# Patient Record
Sex: Female | Born: 2000 | Race: Black or African American | Hispanic: No | Marital: Single | Smoking: Former smoker
Health system: Southern US, Community
[De-identification: ages and names within clinical notes are randomized; demographics above are authoritative.]

## PROBLEM LIST (undated history)

## (undated) DIAGNOSIS — F419 Anxiety disorder, unspecified: Secondary | ICD-10-CM

## (undated) DIAGNOSIS — O139 Gestational [pregnancy-induced] hypertension without significant proteinuria, unspecified trimester: Secondary | ICD-10-CM

## (undated) DIAGNOSIS — J0391 Acute recurrent tonsillitis, unspecified: Secondary | ICD-10-CM

## (undated) DIAGNOSIS — Z789 Other specified health status: Secondary | ICD-10-CM

## (undated) DIAGNOSIS — D649 Anemia, unspecified: Secondary | ICD-10-CM

## (undated) HISTORY — DX: Anxiety disorder, unspecified: F41.9

## (undated) HISTORY — PX: GALLBLADDER SURGERY: SHX652

## (undated) HISTORY — DX: Gestational (pregnancy-induced) hypertension without significant proteinuria, unspecified trimester: O13.9

## (undated) HISTORY — PX: NO PAST SURGERIES: SHX2092

## (undated) HISTORY — DX: Anemia, unspecified: D64.9

---
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
## 2019-01-30 ENCOUNTER — Other Ambulatory Visit: Payer: Self-pay

## 2019-01-30 DIAGNOSIS — Z20822 Contact with and (suspected) exposure to covid-19: Secondary | ICD-10-CM

## 2019-01-31 LAB — NOVEL CORONAVIRUS, NAA: SARS-CoV-2, NAA: NOT DETECTED

## 2019-05-02 ENCOUNTER — Other Ambulatory Visit: Payer: Self-pay

## 2019-05-02 ENCOUNTER — Emergency Department (HOSPITAL_COMMUNITY)
Admission: EM | Admit: 2019-05-02 | Discharge: 2019-05-02 | Disposition: A | Discharge status: Home / Self Care | Payer: BC Managed Care – PPO | Attending: Emergency Medicine | Admitting: Emergency Medicine

## 2019-05-02 ENCOUNTER — Encounter (HOSPITAL_COMMUNITY): Payer: Self-pay | Admitting: Emergency Medicine

## 2019-05-02 DIAGNOSIS — Z20822 Contact with and (suspected) exposure to covid-19: Secondary | ICD-10-CM | POA: Insufficient documentation

## 2019-05-02 DIAGNOSIS — J029 Acute pharyngitis, unspecified: Secondary | ICD-10-CM | POA: Diagnosis present

## 2019-05-02 DIAGNOSIS — J45909 Unspecified asthma, uncomplicated: Secondary | ICD-10-CM | POA: Insufficient documentation

## 2019-05-02 DIAGNOSIS — J069 Acute upper respiratory infection, unspecified: Secondary | ICD-10-CM | POA: Diagnosis not present

## 2019-05-02 LAB — SARS CORONAVIRUS 2 (TAT 6-24 HRS): SARS Coronavirus 2: NEGATIVE

## 2019-05-02 LAB — GROUP A STREP BY PCR: Group A Strep by PCR: NOT DETECTED

## 2019-05-02 LAB — POC URINE PREG, ED: Preg Test, Ur: NEGATIVE

## 2019-05-02 MED ORDER — NAPROXEN 500 MG PO TABS: 500.0000 mg | ORAL_TABLET | Freq: Two times a day (BID) | ORAL | 0 refills | Status: DC | PRN

## 2019-05-02 NOTE — ED Provider Notes (Signed)
[facility] EMERGENCY DEPARTMENT Provider Note   CSN: [id number] Arrival date & time: 05/02/19  1318     History Chief Complaint  Patient presents with  . Sore Throat    Carolyn Jenkins is a 19 y.o. female with no significant PMH presents to the ED with a 10-day history of sore throat with radiation to the right ear.  She states Chloraseptic Spray, but that it continues to be painful to swallow.  Patient reports a history of tube placement for AOM as a child.  She lives with her family reports that they have all been sick, but have all since improved.  Patient also reports that while she does not have a history of asthma, she endorses intermittent wheezing and nonproductive cough.  She denies any chest pain or shortness of breath, headache or dizziness, abdominal pain, nausea or vomiting, or any urinary symptoms.  Patient also reports that her last menses were approximately 2 months ago and she would like to receive pregnancy testing as she is sexually active and does not use protection.  HPI     History reviewed. No pertinent past medical history.  There are no problems to display for this patient.   History reviewed. No pertinent surgical history.   OB History   No obstetric history on file.     No family history on file.  Social History   Tobacco Use  . Smoking status: Never Smoker  . Smokeless tobacco: Never Used  Substance Use Topics  . Alcohol use: No  . Drug use: Never    Home Medications Prior to Admission medications   Medication Sig Start Date End Date Taking? Authorizing Provider  naproxen (NAPROSYN) 500 MG tablet Take 1 tablet (500 mg total) by mouth 2 (two) times daily between meals as needed for moderate pain (Fever, chills, body aches). 05/02/19   Lorelee New, PA-C    Allergies    Patient has no known allergies.  Review of Systems   Review of Systems  All other systems reviewed and are negative.   Physical Exam Updated Vital  Signs BP (!) 141/87   Pulse (!) 112   Temp 99.7 F (37.6 C) (Oral)   Resp 18   SpO2 99%   Physical Exam Vitals and nursing note reviewed. Exam conducted with a chaperone present.  Constitutional:      Appearance: Normal appearance.  HENT:     Head: Normocephalic and atraumatic.     Ears:     Comments: Right ear: Evidence of prior to placement.  Pearly, nonerythematous.  Nonbulging.  Cone of light nondisplaced.  Mild cerumen in canal, but no impaction or swelling. Left ear: Normal.    Mouth/Throat:     Comments: Clear, patent oropharynx.  Mild tonsillitis bilaterally, but no exudates or stones noted.  No uvular deviation.  No tongue swelling or soft palate induration.  Normal dentition throughout.  No masses appreciated.  Tolerating secretions well. Eyes:     General: No scleral icterus.    Conjunctiva/sclera: Conjunctivae normal.  Pulmonary:     Effort: Pulmonary effort is normal.  Skin:    General: Skin is dry.  Neurological:     Mental Status: She is alert and oriented to person, place, and time.     GCS: GCS eye subscore is 4. GCS verbal subscore is 5. GCS motor subscore is 6.  Psychiatric:        Mood and Affect: Mood normal.  Behavior: Behavior normal.        Thought Content: Thought content normal.      ED Results / Procedures / Treatments   Labs (all labs ordered are listed, but only abnormal results are displayed) Labs Reviewed  GROUP A STREP BY PCR  SARS CORONAVIRUS 2 (TAT 6-24 HRS)  POC URINE PREG, ED    EKG None  Radiology No results found.  Procedures Procedures (including critical care time)  Medications Ordered in ED Medications - No data to display  ED Course  I have reviewed the triage vital signs and the nursing notes.  Pertinent labs & imaging results that were available during my care of the patient were reviewed by me and considered in my medical decision making (see chart for details).    MDM Rules/Calculators/A&P                       Patient admits that she has not been eating or drinking very well due to her sore throat symptoms.  She is mildly tachycardic, but likely at least partially attributable to dehydration.  Patient is negative for pregnancy.  Physical exam was benign.  Patient appeared to be in no acute distress.  There is no evidence of trismus, oropharynx is patent, no uvular deviation or masses appreciated, she was tolerating secretions without difficulty, and her ear exam was normal.  Given her group A strep PCR being negative, do not feel as though antibiotics are warranted at this time.  Instead, will continue to emphasize importance of over-the-counter medications for symptomatic relief.  While patient endorses 10-day history of sore throat in addition to intermittent nonproductive cough, will obtain send-out COVID-19 testing.  Instructed patient to continue to adhere to isolation guidelines while results pend.    Strict return precautions discussed with the patient.  All of the evaluation and work-up results were discussed with the patient and any family at bedside. They were provided opportunity to ask any additional questions and have none at this time. They have expressed understanding of verbal discharge instructions as well as return precautions and are agreeable to the plan.    Final Clinical Impression(s) / ED Diagnoses Final diagnoses:  Viral upper respiratory tract infection    Rx / DC Orders ED Discharge Orders         Ordered    naproxen (NAPROSYN) 500 MG tablet  2 times daily between meals PRN     05/02/19 1531           Corena Herter, PA-C 05/02/19 1531    Sherwood Gambler, MD 05/02/19 [phone]

## 2019-05-02 NOTE — ED Notes (Signed)
Patient Alert and oriented to baseline. Stable and ambulatory to baseline. Patient verbalized understanding of the discharge instructions.  Patient belongings were taken by the patient.   

## 2019-05-02 NOTE — ED Triage Notes (Signed)
C/o sore throat x 2 weeks that radiates to R ear.

## 2019-05-02 NOTE — Discharge Instructions (Signed)
In addition to [employer], please consider taking the naproxen as prescribed.  Also please use throat lozenges, warm tea with honey, and other over-the-counter medications for symptomatic relief of your cold symptoms.  Please continue to follow isolation guidelines pending results of your COVID-19 testing.

## 2019-05-04 ENCOUNTER — Other Ambulatory Visit: Payer: Self-pay

## 2019-05-04 ENCOUNTER — Emergency Department (HOSPITAL_COMMUNITY): Payer: BC Managed Care – PPO

## 2019-05-04 ENCOUNTER — Emergency Department (HOSPITAL_COMMUNITY)
Admission: EM | Admit: 2019-05-04 | Discharge: 2019-05-04 | Disposition: A | Discharge status: Home / Self Care | Payer: BC Managed Care – PPO | Attending: Emergency Medicine | Admitting: Emergency Medicine

## 2019-05-04 DIAGNOSIS — B349 Viral infection, unspecified: Secondary | ICD-10-CM | POA: Insufficient documentation

## 2019-05-04 DIAGNOSIS — Z20822 Contact with and (suspected) exposure to covid-19: Secondary | ICD-10-CM | POA: Insufficient documentation

## 2019-05-04 DIAGNOSIS — J029 Acute pharyngitis, unspecified: Secondary | ICD-10-CM | POA: Diagnosis present

## 2019-05-04 LAB — MONONUCLEOSIS SCREEN: Mono Screen: NEGATIVE

## 2019-05-04 LAB — I-STAT CHEM 8, ED
BUN: 4 mg/dL — ABNORMAL LOW (ref 6–20)
Calcium, Ion: 1.13 mmol/L — ABNORMAL LOW (ref 1.15–1.40)
Chloride: 105 mmol/L (ref 98–111)
Creatinine, Ser: 0.6 mg/dL (ref 0.44–1.00)
Glucose, Bld: 105 mg/dL — ABNORMAL HIGH (ref 70–99)
HCT: 39 % (ref 36.0–46.0)
Hemoglobin: 13.3 g/dL (ref 12.0–15.0)
Potassium: 4.2 mmol/L (ref 3.5–5.1)
Sodium: 139 mmol/L (ref 135–145)
TCO2: 26 mmol/L (ref 22–32)

## 2019-05-04 LAB — I-STAT BETA HCG BLOOD, ED (MC, WL, AP ONLY): I-stat hCG, quantitative: <5 m[IU]/mL (ref <5)

## 2019-05-04 LAB — GROUP A STREP BY PCR: Group A Strep by PCR: NOT DETECTED

## 2019-05-04 MED ORDER — IOHEXOL 300 MG/ML  SOLN
75.0000 mL | Freq: Once | INTRAMUSCULAR | Status: AC | PRN
  Administered 2019-05-04: 75 mL via INTRAVENOUS

## 2019-05-04 MED ORDER — ACETAMINOPHEN 325 MG PO TABS
650.0000 mg | ORAL_TABLET | Freq: Once | ORAL | Status: AC
  Administered 2019-05-04: 650 mg via ORAL
  Filled 2019-05-04: qty 2

## 2019-05-04 MED ORDER — DEXAMETHASONE SODIUM PHOSPHATE 10 MG/ML IJ SOLN
10.0000 mg | Freq: Once | INTRAMUSCULAR | Status: AC
  Administered 2019-05-04: 10 mg via INTRAMUSCULAR
  Filled 2019-05-04: qty 1

## 2019-05-04 MED ORDER — LIDOCAINE VISCOUS HCL 2 % MT SOLN: 15.0000 mL | OROMUCOSAL | 0 refills | Status: DC | PRN

## 2019-05-04 MED ORDER — LIDOCAINE VISCOUS HCL 2 % MT SOLN
15.0000 mL | Freq: Once | OROMUCOSAL | Status: AC
  Administered 2019-05-04: 15 mL via OROMUCOSAL
  Filled 2019-05-04: qty 15

## 2019-05-04 MED ORDER — KETOROLAC TROMETHAMINE 30 MG/ML IJ SOLN
30.0000 mg | Freq: Once | INTRAMUSCULAR | Status: AC
  Administered 2019-05-04: 30 mg via INTRAMUSCULAR
  Filled 2019-05-04: qty 1

## 2019-05-04 NOTE — ED Provider Notes (Signed)
[facility] EMERGENCY DEPARTMENT Provider Note   CSN: [id number] Arrival date & time: 05/04/19  1143     History Chief Complaint  Patient presents with  . Sore Throat    Carolyn Jenkins is a 19 y.o. female with no significant past medical history who presents to the ED due to worsening sore throat x2 weeks that radiates into her right ear.  Chart reviewed.  Patient was seen in the ED on 05/02/2019 for the same complaint and was negative for Covid and strep throat.  Patient states her sore throat has gotten worse and is associated with a fever, headache, and right otalgia. T. Max 101 this morning. Patient rates her pain a 10/10. Sore throat is worse on the right side and associated with changes to her voice. Her mother just had a positive COVID exposure. Patient denies cough, shortness of breath, abdominal pain, nausea, vomiting, and diarrhea. Patient denies difficulties swallowing, but admits to increased pain with swallowing.    No past medical history on file.  There are no problems to display for this patient.   No past surgical history on file.   OB History   No obstetric history on file.     No family history on file.  Social History   Tobacco Use  . Smoking status: Never Smoker  . Smokeless tobacco: Never Used  Substance Use Topics  . Alcohol use: No  . Drug use: Never    Home Medications Prior to Admission medications   Medication Sig Start Date End Date Taking? Authorizing Provider  naproxen (NAPROSYN) 500 MG tablet Take 1 tablet (500 mg total) by mouth 2 (two) times daily between meals as needed for moderate pain (Fever, chills, body aches). 05/02/19  Yes Lorelee New, PA-C  lidocaine (XYLOCAINE) 2 % solution Use as directed 15 mLs in the mouth or throat every 4 (four) hours as needed for mouth pain. 05/04/19   Mannie Stabile, PA-C    Allergies    Patient has no known allergies.  Review of Systems   Review of Systems  Constitutional:  Positive for appetite change (decreased due to pain) and fever. Negative for chills.  HENT: Positive for ear pain (right) and sore throat. Negative for drooling, rhinorrhea, trouble swallowing and voice change.   Respiratory: Negative for shortness of breath.   Cardiovascular: Negative for chest pain.  Gastrointestinal: Negative for abdominal pain, diarrhea, nausea and vomiting.  Neurological: Positive for headaches.  All other systems reviewed and are negative.   Physical Exam Updated Vital Signs BP 123/67 (BP Location: Left Arm)   Pulse (!) 118   Temp 99.9 F (37.7 C) (Oral)   Resp 18   Ht 5\' 9"  (1.753 m)   Wt 104.3 kg   SpO2 100%   BMI 33.97 kg/m   Physical Exam Vitals and nursing note reviewed.  Constitutional:      General: She is not in acute distress.    Appearance: She is not toxic-appearing.  HENT:     Head: Normocephalic.     Right Ear: Tympanic membrane normal. There is impacted cerumen.     Left Ear: Tympanic membrane normal. There is impacted cerumen.     Ears:     Comments: Both ear canals impacted with cerumen which makes full visualization of TM difficulty. No bulging or erythema of either TM.     Nose: Nose normal.     Mouth/Throat:     Comments: Posterior oropharynx clear and mucous membranes moist,  there is mild erythema with 2+ tonsillar hypertrophy worse on the right side with mild uvula deviation. no tonsillar exudates, no trismus, tolerating secretions without difficulty. Mild muffled phonation  Eyes:     Pupils: Pupils are equal, round, and reactive to light.  Neck:     Comments: No meningismus Cardiovascular:     Rate and Rhythm: Regular rhythm. Tachycardia present.     Pulses: Normal pulses.     Heart sounds: Normal heart sounds. No murmur. No friction rub. No gallop.   Pulmonary:     Effort: Pulmonary effort is normal.     Breath sounds: Normal breath sounds.  Abdominal:     General: Abdomen is flat. There is no distension.     Palpations:  Abdomen is soft.     Tenderness: There is no abdominal tenderness. There is no guarding or rebound.  Musculoskeletal:     Cervical back: Normal range of motion and neck supple.     Comments: Able to move all 4 extremities without difficulty.   Skin:    General: Skin is warm.  Neurological:     General: No focal deficit present.     Mental Status: She is alert.  Psychiatric:        Mood and Affect: Mood normal.     ED Results / Procedures / Treatments   Labs (all labs ordered are listed, but only abnormal results are displayed) Labs Reviewed  I-STAT CHEM 8, ED - Abnormal; Notable for the following components:      Result Value   BUN 4 (*)    Glucose, Bld 105 (*)    Calcium, Ion 1.13 (*)    All other components within normal limits  GROUP A STREP BY PCR  NOVEL CORONAVIRUS, NAA (HOSP ORDER, SEND-OUT TO REF LAB; TAT 18-24 HRS)  MONONUCLEOSIS SCREEN  I-STAT BETA HCG BLOOD, ED ([facility], [facility], [facility] ONLY)    EKG None  Radiology CT Soft Tissue Neck W Contrast  Result Date: 05/04/2019 CLINICAL DATA:  Sore throat EXAM: CT NECK WITH CONTRAST TECHNIQUE: Multidetector CT imaging of the neck was performed using the standard protocol following the bolus administration of intravenous contrast. CONTRAST:  66mL OMNIPAQUE IOHEXOL 300 MG/ML  SOLN COMPARISON:  None. FINDINGS: Pharynx and larynx: There is asymmetric right palatine tonsil enlargement. Ill-defined low-attenuation is present, but there is also significant streak artifact and this may be artifactual. Epiglottis is normal. Airway is patent. Salivary glands: Parotid and submandibular glands. Thyroid: Unremarkable. Lymph nodes: There are no enlarged lymph nodes. Vascular: Major neck vessels are patent. Limited intracranial: No abnormal enhancement. Visualized orbits: Unremarkable Mastoids and visualized paranasal sinuses: Aerated Skeleton: Mild reversal of cervical lordosis.  Normal Upper chest: Included lung apices are clear. Other: None. IMPRESSION:  Asymmetric right palatine tonsil enlargement. Ill-defined low attenuation is present, but this may be artifactual and there is no definite abscess. Electronically Signed   By: Macy Mis M.D.   On: 05/04/2019 15:53    Procedures Procedures (including critical care time)  Medications Ordered in ED Medications  acetaminophen (TYLENOL) tablet 650 mg (has no administration in time range)  dexamethasone (DECADRON) injection 10 mg (has no administration in time range)  ketorolac (TORADOL) 30 MG/ML injection 30 mg (has no administration in time range)  lidocaine (XYLOCAINE) 2 % viscous mouth solution 15 mL (15 mLs Mouth/Throat Given 05/04/19 1428)  iohexol (OMNIPAQUE) 300 MG/ML solution 75 mL (75 mLs Intravenous Contrast Given 05/04/19 1526)    ED Course  I have reviewed  the triage vital signs and the nursing notes.  Pertinent labs & imaging results that were available during my care of the patient were reviewed by me and considered in my medical decision making (see chart for details).    MDM Rules/Calculators/A&P                     19 year old female presents to the ED due to worsening sore throat x2 weeks.  Patient was seen on 05/02/2019 with reassuring work-up and was negative for Covid and strep throat.  Sore throat is worse on the right side associated with changes to voice.  Patient is borderline febrile at 99.9 F and tachycardic at 118 likely due to to fever. Chart reviewed and patient appears to typically have an elevated heart rate. Patient in no acute distress and non-toxic appearing. Posterior oropharynx clear and mucous membranes moist, there is mild erythema with 2+ tonsillar hypertrophy worse on the right side with mild uvula deviation. no tonsillar exudates, no trismus, tolerating secretions without difficulty. Mild muffled phonation. Given patient's increased edema on right side, will obtain CT scan to rule out abscess.   Mono spot negative.  Strep test negative.  Covid test  pending. Patient advised to self-quarantine until results become available. Chem-8 reassuring.  CT scan personally reviewed which demonstrates: Asymmetric right palatine tonsil enlargement. Ill-defined low  attenuation is present, but this may be artifactual and there is no  definite abscess.   Will treat with decadron and Toradol here in the ED. Will treat symptomatically with viscous lidocaine. Patient advised to follow-up with pediatrician within the next week for further evaluation. Strict ED precautions discussed with patient. Patient states understanding and agrees to plan. Patient discharged home in no acute distress and stable vitals  Final Clinical Impression(s) / ED Diagnoses Final diagnoses:  Viral pharyngitis    Rx / DC Orders ED Discharge Orders         Ordered    lidocaine (XYLOCAINE) 2 % solution  Every 4 hours PRN     05/04/19 1622           Mannie Stabile, PA-C 05/04/19 1652    Melene Plan, DO 05/05/19 1503

## 2019-05-04 NOTE — Discharge Instructions (Addendum)
As discussed, your strep and mono test were negative. Your COVID test is still pending. Continue to self quarantine until your results become available. I am sending you home with numbing solution for your throat. You may use as needed for pain. Continue to take Tylenol if you develop a fever. Drink a lot of fluids. Your CT scan was negative for any abscess in your throat. Follow-up with your pediatrician within the next week for further evaluation. Return to the ER for new or worsening symptoms.

## 2019-05-04 NOTE — ED Triage Notes (Signed)
Sore throat x 2 weeks- seen here Saturday-- neg for strep

## 2019-05-05 LAB — NOVEL CORONAVIRUS, NAA (HOSP ORDER, SEND-OUT TO REF LAB; TAT 18-24 HRS): SARS-CoV-2, NAA: NOT DETECTED

## 2019-05-08 ENCOUNTER — Other Ambulatory Visit: Payer: Self-pay

## 2019-05-08 ENCOUNTER — Emergency Department (HOSPITAL_COMMUNITY)
Admission: EM | Admit: 2019-05-08 | Discharge: 2019-05-08 | Disposition: A | Discharge status: Home / Self Care | Payer: BC Managed Care – PPO | Attending: Emergency Medicine | Admitting: Emergency Medicine

## 2019-05-08 DIAGNOSIS — J36 Peritonsillar abscess: Secondary | ICD-10-CM | POA: Insufficient documentation

## 2019-05-08 DIAGNOSIS — J029 Acute pharyngitis, unspecified: Secondary | ICD-10-CM | POA: Diagnosis present

## 2019-05-08 LAB — CBC WITH DIFFERENTIAL/PLATELET
Abs Immature Granulocytes: 0.05 10*3/uL (ref 0.00–0.07)
Basophils Absolute: 0 10*3/uL (ref 0.0–0.1)
Basophils Relative: 0 %
Eosinophils Absolute: 0 10*3/uL (ref 0.0–0.5)
Eosinophils Relative: 0 %
HCT: 39.4 % (ref 36.0–46.0)
Hemoglobin: 12.8 g/dL (ref 12.0–15.0)
Immature Granulocytes: 0 %
Lymphocytes Relative: 15 %
Lymphs Abs: 1.7 10*3/uL (ref 0.7–4.0)
MCH: 27.8 pg (ref 26.0–34.0)
MCHC: 32.5 g/dL (ref 30.0–36.0)
MCV: 85.7 fL (ref 80.0–100.0)
Monocytes Absolute: 1.1 10*3/uL — ABNORMAL HIGH (ref 0.1–1.0)
Monocytes Relative: 10 %
Neutro Abs: 8.6 10*3/uL — ABNORMAL HIGH (ref 1.7–7.7)
Neutrophils Relative %: 75 %
Platelets: 344 10*3/uL (ref 150–400)
RBC: 4.6 MIL/uL (ref 3.87–5.11)
RDW: 13.6 % (ref 11.5–15.5)
WBC: 11.4 10*3/uL — ABNORMAL HIGH (ref 4.0–10.5)
nRBC: 0 % (ref 0.0–0.2)

## 2019-05-08 LAB — BASIC METABOLIC PANEL
Anion gap: 12 (ref 5–15)
BUN: 7 mg/dL (ref 6–20)
CO2: 24 mmol/L (ref 22–32)
Calcium: 9.1 mg/dL (ref 8.9–10.3)
Chloride: 103 mmol/L (ref 98–111)
Creatinine, Ser: 0.84 mg/dL (ref 0.44–1.00)
GFR calc Af Amer: >60 mL/min (ref >60)
GFR calc non Af Amer: >60 mL/min (ref >60)
Glucose, Bld: 94 mg/dL (ref 70–99)
Potassium: 4.1 mmol/L (ref 3.5–5.1)
Sodium: 139 mmol/L (ref 135–145)

## 2019-05-08 MED ORDER — CLINDAMYCIN HCL 300 MG PO CAPS: 300.0000 mg | ORAL_CAPSULE | Freq: Three times a day (TID) | ORAL | 0 refills | Status: DC

## 2019-05-08 MED ORDER — ONDANSETRON HCL 4 MG/2ML IJ SOLN
4.0000 mg | Freq: Once | INTRAMUSCULAR | Status: AC
  Administered 2019-05-08: 19:00:00 4 mg via INTRAVENOUS
  Filled 2019-05-08: qty 2

## 2019-05-08 MED ORDER — SODIUM CHLORIDE 0.9 % IV BOLUS
1000.0000 mL | Freq: Once | INTRAVENOUS | Status: AC
  Administered 2019-05-08: 19:00:00 1000 mL via INTRAVENOUS

## 2019-05-08 MED ORDER — CLINDAMYCIN PHOSPHATE 600 MG/50ML IV SOLN
600.0000 mg | Freq: Once | INTRAVENOUS | Status: AC
  Administered 2019-05-08: 19:00:00 600 mg via INTRAVENOUS
  Filled 2019-05-08: qty 50

## 2019-05-08 MED ORDER — BENZOCAINE 20 % MT AERO
INHALATION_SPRAY | Freq: Once | OROMUCOSAL | Status: DC
  Filled 2019-05-08: qty 57

## 2019-05-08 MED ORDER — DEXAMETHASONE SODIUM PHOSPHATE 10 MG/ML IJ SOLN
10.0000 mg | Freq: Once | INTRAMUSCULAR | Status: AC
  Administered 2019-05-08: 19:00:00 10 mg via INTRAVENOUS
  Filled 2019-05-08: qty 1

## 2019-05-08 MED ORDER — MORPHINE SULFATE (PF) 4 MG/ML IV SOLN
4.0000 mg | Freq: Once | INTRAVENOUS | Status: AC
  Administered 2019-05-08: 4 mg via INTRAVENOUS
  Filled 2019-05-08: qty 1

## 2019-05-08 NOTE — ED Provider Notes (Signed)
[facility] EMERGENCY DEPARTMENT Provider Note   CSN: [id number] Arrival date & time: 05/08/19  1553     History Chief Complaint  Patient presents with  . Sore Throat    Carolyn Jenkins is a 19 y.o. female.  19 year old female presents with complaint of ongoing and worsening right-sided sore throat with painful swallowing.  Patient reports symptom onset 2 weeks ago, progressively worsening.  Patient was seen in the emergency room on January 9 and January 11 for same.  Patient has had 2 - Covid tests, 2 - strep test, did have a negative mono test on January 11.  Patient had a CT scan on January 11 showing asymmetric right pontine tonsil enlargement without obvious abscess.  Patient was given Decadron and Toradol at that visit and advised to follow-up with her PCP.  Patient followed up with PCP today and was referred to the ER.  Patient reports some discomfort with opening her mouth, unable to fully open her mouth.  Reports feeling feverish at home with temps of 99.5.  Patient was able to eat some chicken noodle soup today but reports it is very difficult and painful to swallow.  No other complaints or concerns.        No past medical history on file.  There are no problems to display for this patient.   No past surgical history on file.   OB History   No obstetric history on file.     No family history on file.  Social History   Tobacco Use  . Smoking status: Never Smoker  . Smokeless tobacco: Never Used  Substance Use Topics  . Alcohol use: No  . Drug use: Never    Home Medications Prior to Admission medications   Medication Sig Start Date End Date Taking? Authorizing Provider  clindamycin (CLEOCIN) 300 MG capsule Take 1 capsule (300 mg total) by mouth 3 (three) times daily for 10 days. 05/08/19 05/18/19  Suella Broad A, PA-C  lidocaine (XYLOCAINE) 2 % solution Use as directed 15 mLs in the mouth or throat every 4 (four) hours as needed for mouth  pain. 05/04/19   Suzy Bouchard, PA-C  naproxen (NAPROSYN) 500 MG tablet Take 1 tablet (500 mg total) by mouth 2 (two) times daily between meals as needed for moderate pain (Fever, chills, body aches). 05/02/19   Corena Herter, PA-C    Allergies    Patient has no known allergies.  Review of Systems   Review of Systems  Constitutional: Positive for fever.  HENT: Positive for sore throat. Negative for congestion.   Respiratory: Negative for cough.   Gastrointestinal: Negative for nausea and vomiting.  Musculoskeletal: Negative for arthralgias and myalgias.  Skin: Negative for rash and wound.  Allergic/Immunologic: Negative for immunocompromised state.  Neurological: Negative for weakness.  Hematological: Negative for adenopathy.  Psychiatric/Behavioral: Negative for confusion.  All other systems reviewed and are negative.   Physical Exam Updated Vital Signs BP 112/88   Pulse (!) 102   Temp 98.6 F (37 C) (Oral)   Resp 16   SpO2 99%   Physical Exam Vitals and nursing note reviewed.  Constitutional:      General: She is not in acute distress.    Appearance: She is well-developed. She is not diaphoretic.  HENT:     Head: Normocephalic and atraumatic.     Jaw: Trismus present.     Comments: 2 finger trismus, muffled voice     Mouth/Throat:  Mouth: Mucous membranes are moist.     Tonsils: No tonsillar exudate.   Cardiovascular:     Rate and Rhythm: Normal rate and regular rhythm.  Pulmonary:     Effort: Pulmonary effort is normal.     Breath sounds: Normal breath sounds.  Lymphadenopathy:     Cervical: No cervical adenopathy.  Skin:    General: Skin is warm and dry.     Findings: No erythema.  Neurological:     Mental Status: She is alert and oriented to person, place, and time.  Psychiatric:        Behavior: Behavior normal.     ED Results / Procedures / Treatments   Labs (all labs ordered are listed, but only abnormal results are displayed) Labs  Reviewed  CBC WITH DIFFERENTIAL/PLATELET - Abnormal; Notable for the following components:      Result Value   WBC 11.4 (*)    Neutro Abs 8.6 (*)    Monocytes Absolute 1.1 (*)    All other components within normal limits  AEROBIC/ANAEROBIC CULTURE (SURGICAL/DEEP WOUND)  BASIC METABOLIC PANEL    EKG None  Radiology No results found.  Procedures Procedures (including critical care time)  Medications Ordered in ED Medications  Benzocaine (HURRCAINE) 20 % mouth spray ( Mouth/Throat Not Given 05/08/19 2123)  sodium chloride 0.9 % bolus 1,000 mL (0 mLs Intravenous Stopped 05/08/19 2024)  dexamethasone (DECADRON) injection 10 mg (10 mg Intravenous Given 05/08/19 1842)  clindamycin (CLEOCIN) IVPB 600 mg (0 mg Intravenous Stopped 05/08/19 1913)  ondansetron (ZOFRAN) injection 4 mg (4 mg Intravenous Given 05/08/19 1842)  morphine 4 MG/ML injection 4 mg (4 mg Intravenous Given 05/08/19 1842)    ED Course  I have reviewed the triage vital signs and the nursing notes.  Pertinent labs & imaging results that were available during my care of the patient were reviewed by me and considered in my medical decision making (see chart for details).  Clinical Course as of May 07 2133  Fri May 08, 2019  6650 19 year old female with 2-weeks of progressively worsening right-sided sore throat.  On exam the soft palate on the right significantly obstructs view of the tonsils and oropharynx, fullness of the left soft palate as well.  Patient tolerates exam with tongue depressor very well.  There is no cervical of adenopathy appreciated.  Patient appears to have approximate 2 finger trismus.  Patient is afebrile, she is mildly tachycardic although noted to be still on prior exams.  Discussed with Dr. Madilyn Hook, ER attending who has seen the patient, notes a similar exam findings, Dr. Madilyn Hook is able to see the uvula and posterior oropharynx although exam significantly limited.  Patient just had a CT scan soft tissue neck,  recommends consult with ENT for further guidance, will hold off on imaging at this time as patient had a CT scan 4 days ago.   [LM]  1812 Case discussed with Dr. Pollyann Kennedy, on call with ENT, recommends Clindamycin 300mg  TID, dc if tolerating Pos, follow up in the office next week if not improving, return to ER at any time for worsening or concerning symptoms.    [LM]  1839 Patient was seen by Dr. , ER attending, see his note regarding I&D of large right peritonsillar abscess.  Patient is feeling much better after needle aspiration of nearly 16 cc purulent material which was sent for culture.  Patient is phonating much clear, tolerating p.o. fluids.  Discussed today's visit with patient's mom with patient's consent.  Patient will start antibiotics as prescribed with plan for follow-up with ENT next week, mother and patient verbalized understanding of strict return to ER precautions.   [LM]    Clinical Course User Index [LM] Alden Hipp   MDM Rules/Calculators/A&P                      Final Clinical Impression(s) / ED Diagnoses Final diagnoses:  Peritonsillar abscess    Rx / DC Orders ED Discharge Orders         Ordered    clindamycin (CLEOCIN) 300 MG capsule  3 times daily     05/08/19 2118           Alden Hipp 05/08/19 2135    Eber Hong, MD 05/08/19 2158

## 2019-05-08 NOTE — ED Provider Notes (Signed)
Medical screening examination/treatment/procedure(s) were conducted as a shared visit with non-physician practitioner(s) and myself.  I personally evaluated the patient during the encounter.  Clinical Impression:   Final diagnoses:  Peritonsillar abscess   This patient is a 19 year old female presenting with recurrent and ongoing sore throat over the last 2 weeks, no abscess seen on CT scan from several days ago however she presents today with worsening swelling.  On exam she has a giant peritonsillar abscess clinically.  This was successfully drained, see procedure note attached.  The patient will be placed on antibiotics and follow-up with ear nose and throat.  The patient has a very patent oropharynx and is compliant with medications and understands indications for return.  Marland Kitchen.Incision and Drainage  Date/Time: 05/08/2019 8:51 PM Performed by: Eber Hong, MD Authorized by: Eber Hong, MD   Consent:    Consent obtained:  Verbal   Consent given by:  Patient   Risks discussed:  Bleeding, damage to other organs, incomplete drainage, infection and pain   Alternatives discussed:  Delayed treatment Location:    Type:  Abscess   Size:  Large   Location:  Mouth (peritonsillar R)   Mouth location:  Peritonsillar Anesthesia (see MAR for exact dosages):    Anesthesia method:  Local infiltration   Local anesthetic:  Bupivacaine 0.25% w/o epi Procedure type:    Complexity:  Complex Procedure details:    Needle aspiration: yes     Drainage:  Purulent   Drainage amount:  Copious Post-procedure details:    Patient tolerance of procedure:  Tolerated well, no immediate complications Comments:     15 to 16 cc of purulent and slightly bloody material was aspirated from the abscess        Eber Hong, MD 05/08/19 2158

## 2019-05-08 NOTE — Discharge Instructions (Addendum)
Take antibiotics as prescribed and complete the full course. It is very important that you stay hydrated with water or electrolyte solution such as Gatorade. Take Motrin and Tylenol as needed as directed for pain.  Follow-up with ENT next week, you can call on Monday to schedule an appointment. Return to the emergency room at anytime for any worsening or concerning symptoms.

## 2019-05-08 NOTE — ED Triage Notes (Signed)
Pt here for sore throat x 3 weeks. Seen for same on 1/11, no change since that visit.

## 2019-05-09 ENCOUNTER — Other Ambulatory Visit: Payer: Self-pay

## 2019-05-09 ENCOUNTER — Inpatient Hospital Stay (HOSPITAL_COMMUNITY)
Admission: EM | Admit: 2019-05-09 | Discharge: 2019-05-12 | DRG: 153 | Disposition: A | Discharge status: Home / Self Care | Payer: BC Managed Care – PPO | Attending: Internal Medicine | Admitting: Internal Medicine

## 2019-05-09 ENCOUNTER — Encounter (HOSPITAL_COMMUNITY): Payer: Self-pay | Admitting: Emergency Medicine

## 2019-05-09 DIAGNOSIS — Z20822 Contact with and (suspected) exposure to covid-19: Secondary | ICD-10-CM | POA: Diagnosis present

## 2019-05-09 DIAGNOSIS — J36 Peritonsillar abscess: Principal | ICD-10-CM | POA: Diagnosis present

## 2019-05-09 DIAGNOSIS — E669 Obesity, unspecified: Secondary | ICD-10-CM | POA: Diagnosis present

## 2019-05-09 DIAGNOSIS — J039 Acute tonsillitis, unspecified: Secondary | ICD-10-CM | POA: Diagnosis present

## 2019-05-09 DIAGNOSIS — Z79899 Other long term (current) drug therapy: Secondary | ICD-10-CM

## 2019-05-09 DIAGNOSIS — Z791 Long term (current) use of non-steroidal anti-inflammatories (NSAID): Secondary | ICD-10-CM

## 2019-05-09 DIAGNOSIS — F17290 Nicotine dependence, other tobacco product, uncomplicated: Secondary | ICD-10-CM | POA: Diagnosis present

## 2019-05-09 DIAGNOSIS — Z68.41 Body mass index (BMI) pediatric, greater than or equal to 95th percentile for age: Secondary | ICD-10-CM

## 2019-05-09 MED ORDER — CLINDAMYCIN PHOSPHATE 300 MG/50ML IV SOLN
300.0000 mg | Freq: Once | INTRAVENOUS | Status: AC
  Administered 2019-05-10: 300 mg via INTRAVENOUS
  Filled 2019-05-09: qty 50

## 2019-05-09 NOTE — ED Triage Notes (Signed)
Pt presents with return of R sided throat swelling. Pt reports she had an abscess drained here yesterday.  Pt states she is unable to swallow, voice sounds muffled.

## 2019-05-09 NOTE — ED Notes (Signed)
Carolyn MassonMom - [phone]

## 2019-05-10 ENCOUNTER — Encounter (HOSPITAL_COMMUNITY): Payer: Self-pay | Admitting: Internal Medicine

## 2019-05-10 ENCOUNTER — Other Ambulatory Visit: Payer: Self-pay | Admitting: Internal Medicine

## 2019-05-10 ENCOUNTER — Emergency Department (HOSPITAL_COMMUNITY): Payer: BC Managed Care – PPO

## 2019-05-10 DIAGNOSIS — Z791 Long term (current) use of non-steroidal anti-inflammatories (NSAID): Secondary | ICD-10-CM | POA: Diagnosis not present

## 2019-05-10 DIAGNOSIS — J36 Peritonsillar abscess: Principal | ICD-10-CM

## 2019-05-10 DIAGNOSIS — Z20822 Contact with and (suspected) exposure to covid-19: Secondary | ICD-10-CM | POA: Diagnosis present

## 2019-05-10 DIAGNOSIS — F17290 Nicotine dependence, other tobacco product, uncomplicated: Secondary | ICD-10-CM | POA: Diagnosis present

## 2019-05-10 DIAGNOSIS — Z79899 Other long term (current) drug therapy: Secondary | ICD-10-CM | POA: Diagnosis not present

## 2019-05-10 DIAGNOSIS — J039 Acute tonsillitis, unspecified: Secondary | ICD-10-CM | POA: Diagnosis present

## 2019-05-10 DIAGNOSIS — E669 Obesity, unspecified: Secondary | ICD-10-CM | POA: Diagnosis present

## 2019-05-10 DIAGNOSIS — Z68.41 Body mass index (BMI) pediatric, greater than or equal to 95th percentile for age: Secondary | ICD-10-CM | POA: Diagnosis not present

## 2019-05-10 LAB — CBC WITH DIFFERENTIAL/PLATELET
Abs Immature Granulocytes: 0.04 10*3/uL (ref 0.00–0.07)
Basophils Absolute: 0 10*3/uL (ref 0.0–0.1)
Basophils Relative: 0 %
Eosinophils Absolute: 0 10*3/uL (ref 0.0–0.5)
Eosinophils Relative: 0 %
HCT: 35.6 % — ABNORMAL LOW (ref 36.0–46.0)
Hemoglobin: 11.4 g/dL — ABNORMAL LOW (ref 12.0–15.0)
Immature Granulocytes: 0 %
Lymphocytes Relative: 25 %
Lymphs Abs: 2.7 10*3/uL (ref 0.7–4.0)
MCH: 27.5 pg (ref 26.0–34.0)
MCHC: 32 g/dL (ref 30.0–36.0)
MCV: 86 fL (ref 80.0–100.0)
Monocytes Absolute: 1 10*3/uL (ref 0.1–1.0)
Monocytes Relative: 10 %
Neutro Abs: 6.9 10*3/uL (ref 1.7–7.7)
Neutrophils Relative %: 65 %
Platelets: 331 10*3/uL (ref 150–400)
RBC: 4.14 MIL/uL (ref 3.87–5.11)
RDW: 14.1 % (ref 11.5–15.5)
WBC: 10.8 10*3/uL — ABNORMAL HIGH (ref 4.0–10.5)
nRBC: 0 % (ref 0.0–0.2)

## 2019-05-10 LAB — COMPREHENSIVE METABOLIC PANEL
ALT: 18 U/L (ref 0–44)
AST: 15 U/L (ref 15–41)
Albumin: 3.1 g/dL — ABNORMAL LOW (ref 3.5–5.0)
Alkaline Phosphatase: 67 U/L (ref 38–126)
Anion gap: 9 (ref 5–15)
BUN: 11 mg/dL (ref 6–20)
CO2: 25 mmol/L (ref 22–32)
Calcium: 8.6 mg/dL — ABNORMAL LOW (ref 8.9–10.3)
Chloride: 104 mmol/L (ref 98–111)
Creatinine, Ser: 0.82 mg/dL (ref 0.44–1.00)
GFR calc Af Amer: >60 mL/min (ref >60)
GFR calc non Af Amer: >60 mL/min (ref >60)
Glucose, Bld: 110 mg/dL — ABNORMAL HIGH (ref 70–99)
Potassium: 3.6 mmol/L (ref 3.5–5.1)
Sodium: 138 mmol/L (ref 135–145)
Total Bilirubin: <0.1 mg/dL — ABNORMAL LOW (ref 0.3–1.2)
Total Protein: 7 g/dL (ref 6.5–8.1)

## 2019-05-10 LAB — SARS CORONAVIRUS 2 (TAT 6-24 HRS): SARS Coronavirus 2: NEGATIVE

## 2019-05-10 LAB — HIV ANTIBODY (ROUTINE TESTING W REFLEX): HIV Screen 4th Generation wRfx: NONREACTIVE

## 2019-05-10 MED ORDER — KETOROLAC TROMETHAMINE 30 MG/ML IJ SOLN
15.0000 mg | Freq: Once | INTRAMUSCULAR | Status: AC
  Administered 2019-05-10: 15 mg via INTRAVENOUS
  Filled 2019-05-10: qty 1

## 2019-05-10 MED ORDER — ACETAMINOPHEN 325 MG PO TABS
650.0000 mg | ORAL_TABLET | Freq: Four times a day (QID) | ORAL | Status: DC | PRN
  Administered 2019-05-10 – 2019-05-11 (×3): 650 mg via ORAL
  Filled 2019-05-10 (×3): qty 2

## 2019-05-10 MED ORDER — KETOROLAC TROMETHAMINE 30 MG/ML IJ SOLN
30.0000 mg | Freq: Four times a day (QID) | INTRAMUSCULAR | Status: DC | PRN
  Administered 2019-05-10: 30 mg via INTRAVENOUS
  Filled 2019-05-10 (×2): qty 1

## 2019-05-10 MED ORDER — ONDANSETRON HCL 4 MG/2ML IJ SOLN
4.0000 mg | Freq: Four times a day (QID) | INTRAMUSCULAR | Status: DC | PRN
  Administered 2019-05-10: 4 mg via INTRAVENOUS
  Filled 2019-05-10: qty 2

## 2019-05-10 MED ORDER — HYDROCODONE-ACETAMINOPHEN 7.5-325 MG/15ML PO SOLN
10.0000 mL | Freq: Once | ORAL | Status: AC
  Administered 2019-05-10: 10 mL via ORAL
  Filled 2019-05-10: qty 15

## 2019-05-10 MED ORDER — KETOROLAC TROMETHAMINE 30 MG/ML IJ SOLN
30.0000 mg | Freq: Four times a day (QID) | INTRAMUSCULAR | Status: DC | PRN
  Administered 2019-05-10: 30 mg via INTRAVENOUS
  Filled 2019-05-10: qty 1

## 2019-05-10 MED ORDER — HYDROCODONE-ACETAMINOPHEN 5-325 MG PO TABS
1.0000 | ORAL_TABLET | ORAL | Status: DC | PRN
  Administered 2019-05-10: 1 via ORAL
  Filled 2019-05-10: qty 1

## 2019-05-10 MED ORDER — SODIUM CHLORIDE 0.9 % IV SOLN
3.0000 g | Freq: Four times a day (QID) | INTRAVENOUS | Status: DC
  Administered 2019-05-10 – 2019-05-12 (×8): 3 g via INTRAVENOUS
  Filled 2019-05-10 (×2): qty 3
  Filled 2019-05-10: qty 8
  Filled 2019-05-10 (×4): qty 3
  Filled 2019-05-10: qty 8
  Filled 2019-05-10 (×2): qty 3
  Filled 2019-05-10: qty 8

## 2019-05-10 MED ORDER — PHENOL 1.4 % MT LIQD: 1.0000 | OROMUCOSAL | Status: DC | PRN

## 2019-05-10 MED ORDER — CLINDAMYCIN HCL 150 MG PO CAPS
300.0000 mg | ORAL_CAPSULE | Freq: Once | ORAL | Status: AC
  Administered 2019-05-10: 300 mg via ORAL
  Filled 2019-05-10: qty 2

## 2019-05-10 MED ORDER — HYDROCODONE-ACETAMINOPHEN 5-325 MG PO TABS: 1.0000 | ORAL_TABLET | Freq: Once | ORAL | Status: DC

## 2019-05-10 MED ORDER — SODIUM CHLORIDE 0.9 % IV SOLN
3.0000 g | Freq: Four times a day (QID) | INTRAVENOUS | Status: DC
  Administered 2019-05-10: 3 g via INTRAVENOUS
  Filled 2019-05-10: qty 8
  Filled 2019-05-10: qty 3
  Filled 2019-05-10 (×4): qty 8

## 2019-05-10 MED ORDER — ACETAMINOPHEN 650 MG RE SUPP: 650.0000 mg | Freq: Four times a day (QID) | RECTAL | Status: DC | PRN

## 2019-05-10 MED ORDER — LACTATED RINGERS IV SOLN: INTRAVENOUS | Status: DC

## 2019-05-10 MED ORDER — IOHEXOL 300 MG/ML  SOLN
75.0000 mL | Freq: Once | INTRAMUSCULAR | Status: AC | PRN
  Administered 2019-05-10: 75 mL via INTRAVENOUS

## 2019-05-10 MED ORDER — ONDANSETRON HCL 4 MG PO TABS
4.0000 mg | ORAL_TABLET | Freq: Four times a day (QID) | ORAL | Status: DC | PRN
  Filled 2019-05-10: qty 1

## 2019-05-10 MED ORDER — HYDROCODONE-ACETAMINOPHEN 5-325 MG PO TABS
1.0000 | ORAL_TABLET | ORAL | Status: DC | PRN
  Administered 2019-05-10 – 2019-05-12 (×9): 1 via ORAL
  Filled 2019-05-10 (×10): qty 1

## 2019-05-10 NOTE — ED Notes (Signed)
Lunch of broth and mashed potatoes to room.  Pt has been drinking water and apple juice.

## 2019-05-10 NOTE — Consult Note (Signed)
Reason for Consult: Sore throat Referring Physician: Jonah Blue, MD  Carolyn Jenkins is an 19 y.o. female.  HPI: History of severe sore throat for several days now.  She has been to the emergency department a few times.  She had a CT scan done a few days ago which revealed low density in the right tonsil but not the peritonsillar space.  Reportedly she had this drained and felt better immediately but then started feeling worse again.  She returned again early this morning.  Repeat CT reveals what looks like intratonsillar abscess on the right.  Peritonsillar fat plane appears to be well preserved.  She has never had problems with her throat before.  She was prescribed clindamycin but has only had 1 dose.  History reviewed. No pertinent past medical history.  History reviewed. No pertinent surgical history.  History reviewed. No pertinent family history.  Social History:  reports that she has been smoking cigars. She has never used smokeless tobacco. She reports that she does not drink alcohol or use drugs.  Allergies: No Known Allergies  Medications: Reviewed  Results for orders placed or performed during the hospital encounter of 05/09/19 (from the past 48 hour(s))  CBC with Differential     Status: Abnormal   Collection Time: 05/10/19  1:48 AM  Result Value Ref Range   WBC 10.8 (H) 4.0 - 10.5 K/uL   RBC 4.14 3.87 - 5.11 MIL/uL   Hemoglobin 11.4 (L) 12.0 - 15.0 g/dL   HCT 22.0 (L) 25.4 - 27.0 %   MCV 86.0 80.0 - 100.0 fL   MCH 27.5 26.0 - 34.0 pg   MCHC 32.0 30.0 - 36.0 g/dL   RDW 62.3 76.2 - 83.1 %   Platelets 331 150 - 400 K/uL   nRBC 0.0 0.0 - 0.2 %   Neutrophils Relative % 65 %   Neutro Abs 6.9 1.7 - 7.7 K/uL   Lymphocytes Relative 25 %   Lymphs Abs 2.7 0.7 - 4.0 K/uL   Monocytes Relative 10 %   Monocytes Absolute 1.0 0.1 - 1.0 K/uL   Eosinophils Relative 0 %   Eosinophils Absolute 0.0 0.0 - 0.5 K/uL   Basophils Relative 0 %   Basophils Absolute 0.0 0.0 - 0.1 K/uL   Immature Granulocytes 0 %   Abs Immature Granulocytes 0.04 0.00 - 0.07 K/uL    Comment: Performed at [facility], [address] [place], [place] [address]  Comprehensive metabolic panel     Status: Abnormal   Collection Time: 05/10/19  1:48 AM  Result Value Ref Range   Sodium 138 135 - 145 mmol/L   Potassium 3.6 3.5 - 5.1 mmol/L   Chloride 104 98 - 111 mmol/L   CO2 25 22 - 32 mmol/L   Glucose, Bld 110 (H) 70 - 99 mg/dL   BUN 11 6 - 20 mg/dL   Creatinine, Ser 6.07 0.44 - 1.00 mg/dL   Calcium 8.6 (L) 8.9 - 10.3 mg/dL   Total Protein 7.0 6.5 - 8.1 g/dL   Albumin 3.1 (L) 3.5 - 5.0 g/dL   AST 15 15 - 41 U/L   ALT 18 0 - 44 U/L   Alkaline Phosphatase 67 38 - 126 U/L   Total Bilirubin <0.1 (L) 0.3 - 1.2 mg/dL   GFR calc non Af Amer >60 >60 mL/min   GFR calc Af Amer >60 >60 mL/min   Anion gap 9 5 - 15    Comment: Performed at [facility]  [facility], [address] [place], [place] [address]  SARS CORONAVIRUS 2 (TAT 6-24 HRS) Nasopharyngeal Nasopharyngeal Swab     Status: None   Collection Time: 05/10/19  3:57 AM   Specimen: Nasopharyngeal Swab  Result Value Ref Range   SARS Coronavirus 2 NEGATIVE NEGATIVE    Comment: (NOTE) SARS-CoV-2 target nucleic acids are NOT DETECTED. The SARS-CoV-2 RNA is generally detectable in upper and lower respiratory specimens during the acute phase of infection. Negative results do not preclude SARS-CoV-2 infection, do not rule out co-infections with other pathogens, and should not be used as the sole basis for treatment or other patient management decisions. Negative results must be combined with clinical observations, patient history, and epidemiological information. The expected result is Negative. Fact Sheet for Patients: HairSlick.no Fact Sheet for Healthcare Providers: quierodirigir.com This test is not yet approved or cleared by the [place] FDA and  has been authorized for  detection and/or diagnosis of SARS-CoV-2 by FDA under an Emergency Use Authorization (EUA). This EUA will remain  in effect (meaning this test can be used) for the duration of the COVID-19 declaration under Section 56 4(b)(1) of the Act, 21 U.S.C. section 360bbb-3(b)(1), unless the authorization is terminated or revoked sooner. Performed at [facility], [address], [place], [place] [address]     CT Soft Tissue Neck W Contrast  Result Date: 05/10/2019 CLINICAL DATA:  Initial evaluation for acute right-sided neck swelling. EXAM: CT NECK WITH CONTRAST TECHNIQUE: Multidetector CT imaging of the neck was performed using the standard protocol following the bolus administration of intravenous contrast. CONTRAST:  26mL OMNIPAQUE IOHEXOL 300 MG/ML  SOLN COMPARISON:  Prior CT from 05/04/2019. FINDINGS: Pharynx and larynx: Oral cavity within normal limits. No acute abnormality about the dentition. Asymmetric enlargement of the right palatine tonsil, compatible with acute tonsillitis. Superimposed rim enhancing hypodense collection measuring 3.7 x 3.0 x 3.7 cm compatible with tonsillar/peritonsillar abscess (series 3, image 33). Enlarged tonsil is medialized with slight extension across the midline, abutting the left palatine tonsil. Associated induration within the adjacent left parapharyngeal fat, with asymmetric fullness at the left nasopharynx. Mucosal edema noted within the adjacent right oropharynx, compatible with concomitant pharyngitis. No retropharyngeal collection. Epiglottis itself is normal. Vallecula clear. Remainder of the hypopharynx and supraglottic larynx within normal limits. True cords symmetric and normal. Subglottic airway clear. Salivary glands: Salivary glands including the parotid and submandibular glands are normal. Thyroid: Normal. Lymph nodes: Mildly prominent bilateral level II lymph nodes measure up to 10 mm on the right and 12 mm on the left. Findings likely reactive. No  other pathologically enlarged or abnormal adenopathy within the neck. Vascular: Normal intravascular enhancement seen throughout the neck. Limited intracranial: Unremarkable. Visualized orbits: Unremarkable. Mastoids and visualized paranasal sinuses: Visualized paranasal sinuses are clear. Mastoid air cells and middle ear cavities are well pneumatized and free of fluid. Skeleton: Reversal of the normal cervical lordosis. No listhesis. No acute osseous abnormality. No discrete osseous lesions. Upper chest: Visualized upper chest demonstrates no acute finding. Other: None. IMPRESSION: 1. Findings consistent with acute right-sided tonsillitis with associated 3.7 x 3.0 x 3.7 cm right tonsillar/peritonsillar abscess. 2. Mildly prominent bilateral level II lymph nodes, likely reactive. Electronically Signed   By: Rise Mu M.D.   On: 05/10/2019 01:46    WGN:[id number] except as listed in admit H&P  Blood pressure 123/76, pulse 89, temperature 98.6 F (37 C), temperature source Oral, resp. rate 14, weight 104 kg, SpO2 100 %.  PHYSICAL EXAM: Overall  appearance:  Healthy appearing, in no distress, she has a slight "hot potato voice" but no stridor or difficulty with secretions. Head:  Normocephalic, atraumatic. Ears: External ears look normal. Nose: External nose is healthy in appearance. Internal nasal exam free of any lesions or obstruction. Oral Cavity/Pharynx:  There are no mucosal lesions or masses identified.  There is no trismus.  There is fullness of the right tonsil that projects into the soft palate. Larynx/Hypopharynx: Deferred Neuro:  No identifiable neurologic deficits. Neck: No palpable neck masses or adenopathy.  Studies Reviewed: CT scan from today and from several days ago were reviewed.  Procedures: none   Assessment/Plan: Acute tonsillitis.  Clinically and radiographically this is not consistent with peritonsillar abscess but is consistent with intratonsillar  infection/abscess.  These typically do not need draining.  This is her only episode of tonsil infection.  Tonsillectomy is not indicated.  She is breathing well and seems to be handling secretions without any difficulty at all.  As long as she can drink adequate amounts of liquids she may be discharged home.  She can follow-up with me during the week if she is not doing better.  She needs to continue on clindamycin.  Izora Gala 05/10/2019, 9:11 AM

## 2019-05-10 NOTE — ED Notes (Signed)
Pt ambulating to bathroom w/o difficulty.  

## 2019-05-10 NOTE — ED Notes (Addendum)
Page sent to Dr. Tyson Babinski for pain medication.

## 2019-05-10 NOTE — ED Notes (Signed)
Pt asking for pain medication.  Dr. Ophelia Charter notified and is coming to see patient soon.

## 2019-05-10 NOTE — ED Notes (Signed)
Pt given apple juice and ice pop.

## 2019-05-10 NOTE — ED Notes (Signed)
Pt resting in room at this time. Respirations even and unlabored.

## 2019-05-10 NOTE — ED Notes (Signed)
Patient transported to CT 

## 2019-05-10 NOTE — Progress Notes (Signed)
Patient states she threw up. Observed very minimal clear spit up in emesis bag, no vomit present.

## 2019-05-10 NOTE — Progress Notes (Signed)
Received patient from ED. Patient alert and oriented x4. Resting comfortably in bed. Started IV fluids and antibiotics. Spoke with patient's mother on the phone explaining the plan of care and visitor policy. Will continue to monitor patient.

## 2019-05-10 NOTE — ED Notes (Signed)
Pt says she was eating breakfast and it was hard for her to swallow solid food and she started coughing and threw up.  Pt cleaned up and clothes changed.  Pt given Zofran 4mg  IV per MAR.

## 2019-05-10 NOTE — ED Notes (Signed)
Pt given a pillow at this time.

## 2019-05-10 NOTE — ED Notes (Signed)
Pt up and to bathroom 

## 2019-05-10 NOTE — ED Notes (Signed)
Pt resting comfortably.  Pt given apple juice.

## 2019-05-10 NOTE — ED Notes (Addendum)
Breakfast ordered.  Per Dr. Pollyann Kennedy, okay for patient to eat.

## 2019-05-10 NOTE — ED Notes (Signed)
Dr. Ophelia Charter to bedside.

## 2019-05-10 NOTE — ED Provider Notes (Signed)
[facility] EMERGENCY DEPARTMENT Provider Note   CSN: [id number] Arrival date & time: 05/09/19  1912     History Chief Complaint  Patient presents with  . Oral Swelling    Carolyn Jenkins is a 19 y.o. female.  19 year old female who returns to the ED for persistent throat pain.  Patient ha visited the ED 4 times in the past week for sore throat.  Patient was noted to have peritonsillar abscess yesterday and was drained in ED.  Patient was prescribed antibiotics and she started them today.  Patient continues to have difficulty swallowing, throat pain, and thinks the swelling might be worse on that side today.  No known fever.  Patient continues to have a muffled voice.  No chest pain.  The history is provided by the patient. No language interpreter was used.  Sore Throat This is a recurrent problem. The current episode started more than 1 week ago. The problem occurs constantly. The problem has not changed since onset.Pertinent negatives include no chest pain, no abdominal pain, no headaches and no shortness of breath. The symptoms are aggravated by swallowing. Nothing relieves the symptoms. Treatments tried: drained and antibiotics. The treatment provided no relief.       History reviewed. No pertinent past medical history.  There are no problems to display for this patient.   History reviewed. No pertinent surgical history.   OB History   No obstetric history on file.     No family history on file.  Social History   Tobacco Use  . Smoking status: Never Smoker  . Smokeless tobacco: Never Used  Substance Use Topics  . Alcohol use: No  . Drug use: Never    Home Medications Prior to Admission medications   Medication Sig Start Date End Date Taking? Authorizing Provider  clindamycin (CLEOCIN) 300 MG capsule Take 1 capsule (300 mg total) by mouth 3 (three) times daily for 10 days. 05/08/19 05/18/19  Army Melia A, PA-C  lidocaine (XYLOCAINE) 2 %  solution Use as directed 15 mLs in the mouth or throat every 4 (four) hours as needed for mouth pain. 05/04/19   Mannie Stabile, PA-C  naproxen (NAPROSYN) 500 MG tablet Take 1 tablet (500 mg total) by mouth 2 (two) times daily between meals as needed for moderate pain (Fever, chills, body aches). 05/02/19   Lorelee New, PA-C    Allergies    Patient has no known allergies.  Review of Systems   Review of Systems  Respiratory: Negative for shortness of breath.   Cardiovascular: Negative for chest pain.  Gastrointestinal: Negative for abdominal pain.  Neurological: Negative for headaches.  All other systems reviewed and are negative.   Physical Exam Updated Vital Signs BP (!) 145/89 (BP Location: Left Arm)   Pulse (!) 106   Temp 98.4 F (36.9 C) (Oral)   Resp 18   Wt 104 kg   SpO2 99%   BMI 33.86 kg/m   Physical Exam Vitals and nursing note reviewed.  Constitutional:      Appearance: She is well-developed.  HENT:     Head: Normocephalic and atraumatic.     Right Ear: External ear normal.     Left Ear: External ear normal.     Mouth/Throat:     Comments: Patient with significant right-sided peritonsillar abscess.  Patient with uvular deviation.  No active drainage. Eyes:     Conjunctiva/sclera: Conjunctivae normal.  Cardiovascular:     Rate and Rhythm: Normal rate.  Heart sounds: Normal heart sounds.  Pulmonary:     Effort: Pulmonary effort is normal.     Breath sounds: Normal breath sounds.  Abdominal:     General: Bowel sounds are normal.     Palpations: Abdomen is soft.     Tenderness: There is no abdominal tenderness. There is no rebound.  Musculoskeletal:        General: Normal range of motion.     Cervical back: Normal range of motion and neck supple.  Skin:    General: Skin is warm.  Neurological:     Mental Status: She is alert and oriented to person, place, and time.     ED Results / Procedures / Treatments   Labs (all labs ordered are  listed, but only abnormal results are displayed) Labs Reviewed - No data to display  EKG None  Radiology No results found.  Procedures Procedures (including critical care time)  Medications Ordered in ED Medications  clindamycin (CLEOCIN) IVPB 300 mg (300 mg Intravenous New Bag/Given 05/10/19 0031)    ED Course  I have reviewed the triage vital signs and the nursing notes.  Pertinent labs & imaging results that were available during my care of the patient were reviewed by me and considered in my medical decision making (see chart for details).    MDM Rules/Calculators/A&P                      19 year old with persistent peritonsillar abscess.  Pt had I&D by ed provider yesterday, however, symptoms persist and patient states the swelling is worse.  Pt received decadron yesterday, will not repeat.  Will obtain cbc, cmp, and CT of neck.  Will give iv clinda.  Pt already COVID negative 6 days ago, so will no repeat.   Signed out pending Ct and likely admit and discussion with ENT.   Final Clinical Impression(s) / ED Diagnoses Final diagnoses:  None    Rx / DC Orders ED Discharge Orders    None       Louanne Skye, MD 05/10/19 [phone]

## 2019-05-10 NOTE — ED Notes (Addendum)
Pt given mac and cheese, italian ice, water, apple juice and a warm blanket at this time.

## 2019-05-10 NOTE — H&P (Signed)
History and Physical   Carolyn Jenkins ONG:[id number] DOB: Dec 13, 2000 DOA: 05/09/2019  PCP: [facility] Consultants:  None Patient coming from:  Home - lives with mother, niece, nephew, boyfriend; NOK: Mother, Dionicia Abler, [phone]  Chief Complaint: Throat pain  HPI: Carolyn Jenkins is a 19 y.o. female with no significant medical history presenting with sore throat.  She has eben having upper chest and R ear/facial pain.  She was told it was URI.  The other day they said it was a tonsillar infection and they drained out the mucus day before ysterday.  She could feel the pressure building up again yesterday and came back.  Temp 98-99.  +pain with swallowing.  Her other contacts in her house are also sick.  They have not been tested for COVID.  She was COVID negative on 1/9 and 1/11.  She was prescribed Clinda on 1/15 but has only taken 1 dose.   ED Course:  Carryover, per Dr. Loney Loh:  19 year old presented to the ED yesterday with complaints of sore throat and was diagnosed with a peritonsillar abscess which was drained. She was sent home on oral clindamycin. Now coming back because of worsening pain and swelling. Repeat CT showing peritonsillar abscess. Afebrile and has mild leukocytosis. No respiratory distress. Able to tolerate secretions. ED provider spoke to Dr. Pollyann Kennedy from ENT who will see the patient in the morning and recommended admission for IV antibiotics. Patient was given clindamycin and Toradol.  Review of Systems: As per HPI; otherwise review of systems reviewed and negative.   Ambulatory Status:  Ambulates without assistance  History reviewed. No pertinent past medical history.  History reviewed. No pertinent surgical history.  Social History   Socioeconomic History  . Marital status: Single    Spouse name: Not on file  . Number of children: Not on file  . Years of education: Not on file  . Highest education level: Not on file  Occupational History   . 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 Smoking status: Current Every Day Smoker    Types: Cigars  . Smokeless tobacco: Never Used  . Tobacco comment: 5 per day, Black and Milds  Substance and Sexual Activity  . Alcohol use: No  . Drug use: Never  . Sexual activity: Not on file  Other Topics Concern  . Not on file  Social History Narrative  . Not on file   Social Determinants of Health   Financial Resource Strain:   . Difficulty of Paying Living Expenses: Not on file  Food Insecurity:   . Worried About Programme researcher, broadcasting/film/video in the Last Year: Not on file  . Ran Out of Food in the Last Year: Not on file  Transportation Needs:   . Lack of Transportation (Medical): Not on file  . Lack of Transportation (Non-Medical): Not on file  Physical Activity:   . Days of Exercise per Week: Not on file  . Minutes of Exercise per Session: Not on file  Stress:   . Feeling of Stress : Not on file  Social Connections:   . Frequency of Communication with Friends and Family: Not on file  . Frequency of Social Gatherings with Friends and Family: Not on file  . Attends Religious Services: Not on file  . Active Member of Clubs or Organizations: Not on file  . Attends Banker Meetings: Not on file  . Marital Status: Not on file  Intimate Partner Violence:   . Fear of  Current or Ex-Partner: Not on file  . Emotionally Abused: Not on file  . Physically Abused: Not on file  . Sexually Abused: Not on file    No Known Allergies  History reviewed. No pertinent family history.  Prior to Admission medications   Medication Sig Start Date End Date Taking? Authorizing Provider  acetaminophen (TYLENOL) 325 MG tablet Take 650 mg by mouth every 6 (six) hours as needed for moderate pain.   Yes [provider]  clindamycin (CLEOCIN) 300 MG capsule Take 1 capsule (300 mg total) by mouth 3 (three) times daily for 10 days. 05/08/19 05/18/19 Yes Jeannie Fend, PA-C  ibuprofen  (ADVIL) 200 MG tablet Take 400 mg by mouth every 6 (six) hours as needed for mild pain or moderate pain.   Yes [provider]  lidocaine (XYLOCAINE) 2 % solution Use as directed 15 mLs in the mouth or throat every 4 (four) hours as needed for mouth pain. 05/04/19  Yes Aberman, Caroline C, PA-C  naproxen (NAPROSYN) 500 MG tablet Take 1 tablet (500 mg total) by mouth 2 (two) times daily between meals as needed for moderate pain (Fever, chills, body aches). Patient not taking: Reported on 05/10/2019 05/02/19   Lorelee New, PA-C    Physical Exam: Vitals:   05/10/19 1041 05/10/19 1130 05/10/19 1151 05/10/19 1200  BP:  (!) 109/55    Pulse:      Resp: 15 18  18   Temp:   98.6 F (37 C)   TempSrc:   Temporal   SpO2:      Weight:         . General:  Appears calm and comfortable and is NAD; mild "hot potato" voice, tolerating secretions including while talking . Eyes:  PERRL, EOMI, normal lids, iris . ENT:  grossly normal hearing, lips & tongue, mmm; R tonsillar fullness . Neck:  no LAD, masses or thyromegaly . Cardiovascular:  RRR, no m/r/g. No LE edema.  Respiratory:   CTA bilaterally with no wheezes/rales/rhonchi.  Normal respiratory effort. . Abdomen:  soft, NT, ND, NABS . Back:   normal alignment, no CVAT . Skin:  no rash or induration seen on limited exam . Musculoskeletal:  grossly normal tone BUE/BLE, good ROM, no bony abnormality . Psychiatric:  grossly normal mood and affect, speech fluent and appropriate, AOx3 . Neurologic:  CN 2-12 grossly intact, moves all extremities in coordinated fashion    Radiological Exams on Admission: CT Soft Tissue Neck W Contrast  Result Date: 05/10/2019 CLINICAL DATA:  Initial evaluation for acute right-sided neck swelling. EXAM: CT NECK WITH CONTRAST TECHNIQUE: Multidetector CT imaging of the neck was performed using the standard protocol following the bolus administration of intravenous contrast. CONTRAST:  51mL OMNIPAQUE IOHEXOL 300  MG/ML  SOLN COMPARISON:  Prior CT from 05/04/2019. FINDINGS: Pharynx and larynx: Oral cavity within normal limits. No acute abnormality about the dentition. Asymmetric enlargement of the right palatine tonsil, compatible with acute tonsillitis. Superimposed rim enhancing hypodense collection measuring 3.7 x 3.0 x 3.7 cm compatible with tonsillar/peritonsillar abscess (series 3, image 33). Enlarged tonsil is medialized with slight extension across the midline, abutting the left palatine tonsil. Associated induration within the adjacent left parapharyngeal fat, with asymmetric fullness at the left nasopharynx. Mucosal edema noted within the adjacent right oropharynx, compatible with concomitant pharyngitis. No retropharyngeal collection. Epiglottis itself is normal. Vallecula clear. Remainder of the hypopharynx and supraglottic larynx within normal limits. True cords symmetric and normal. Subglottic airway clear. Salivary glands:  Salivary glands including the parotid and submandibular glands are normal. Thyroid: Normal. Lymph nodes: Mildly prominent bilateral level II lymph nodes measure up to 10 mm on the right and 12 mm on the left. Findings likely reactive. No other pathologically enlarged or abnormal adenopathy within the neck. Vascular: Normal intravascular enhancement seen throughout the neck. Limited intracranial: Unremarkable. Visualized orbits: Unremarkable. Mastoids and visualized paranasal sinuses: Visualized paranasal sinuses are clear. Mastoid air cells and middle ear cavities are well pneumatized and free of fluid. Skeleton: Reversal of the normal cervical lordosis. No listhesis. No acute osseous abnormality. No discrete osseous lesions. Upper chest: Visualized upper chest demonstrates no acute finding. Other: None. IMPRESSION: 1. Findings consistent with acute right-sided tonsillitis with associated 3.7 x 3.0 x 3.7 cm right tonsillar/peritonsillar abscess. 2. Mildly prominent bilateral level II lymph  nodes, likely reactive. Electronically Signed   By: Jeannine Boga M.D.   On: 05/10/2019 01:46    EKG: not done   Labs on Admission: I have personally reviewed the available labs and imaging studies at the time of the admission.  Pertinent labs:   Glucose 110 WBC 10.8 Hgb 11.4 Albumin 3.1 Abscess culture pending COVID pending    Assessment/Plan Active Problems:   Peritonsillar abscess   Tonsillitis with exudate   -Patient with recurrent ER visit 1/9, 1/11, 1/15 and last night for sore throat -Despite multiple family members who are ill at home and her mother with a recent COVID-7 exposure, the patient again tested negative today -On the 1/15 visit, patient had I&D of 16 cc purulent drainage from R peritonsillar abscess -She was concerned about recurrence and came to the ER again last night -Repeat CT appeared to show recurrence of the abscess and the plan was for overnight admission with AM ENT consultation -ENT saw the patient in the ER this AM and Dr. Constance Holster reviewed and see a well preserved peritonsillar plane and so instead diagnosed acute tonsilitis with an infratonsillar abscess -Given lack of recurrent infections, she does not require tonsillectomy at this time -Further drainage is also not indicated, according to ENT -The patient was offered the option to resume PO Clindamycin and to d/c to home today since she is tolerating her secretions and PO intake at this time -However, given 4 ER visits in a week; persistent pain; and intermittent vomiting while in the ER, the patient requests admission for at least 1 additional night -Will treat with IV Unasyn for now, but can transition to Clinda at the time of d/c -Toradol and PO Vicodin as needed for pain   Note: This patient has been tested and is negative for the novel coronavirus COVID-19.  DVT: Early ambulation Full Code I called and left a message for her mother Admit - It is my clinical opinion that admission  to INPATIENT is reasonable and necessary because of the expectation that this patient will require hospital care that crosses at least 2 midnights to treat this condition based on the medical complexity of the problems presented.  Given the aforementioned information, the predictability of an adverse outcome is felt to be significant.     Karmen Bongo MD [facility]   How to contact the [facility] Attending or Consulting provider [place] or covering provider during after hours [place], for this patient?  1. Check the care team in [facility] and look for a) attending/consulting [facility] provider listed and b) the [facility] team listed 2. Log into [email or web address] and use [place]'s universal password to access. If  you do not have the password, please contact the hospital operator. 3. Locate the [facility] provider you are looking for under [facility] and page to a number that you can be directly reached. 4. If you still have difficulty reaching the provider, please page the [facility] (Director on Call) for the Hospitalists listed on amion for assistance.   05/10/2019, 12:55 PM

## 2019-05-10 NOTE — ED Notes (Signed)
Report Called to [employer].  Pt to be transferred to [id number].

## 2019-05-10 NOTE — ED Provider Notes (Signed)
2:13 AM Patent care assumed from MD Kuhner at shift change pending repeat CT scan.  Patient evaluated in the ED yesterday and diagnosed with PTA. Given IV Decadron and had ~16cc drained by MD Sabra Heck at bedside with some symptomatic improvement. Started on oral Clindamycin, but patient noting worsening symptoms over the past 24 hours.  Patient with muffled voice. Is tolerating her secretions, but taking very little PO due to dysphagia and odynophagia. She is clinically stable and afebrile. Leukocytosis persists, though slightly downtrending. CT reviewed which shows progression from 6 days ago, now with 3.7 x 3.0 x 3.7cm R tonsillar/peritonsillar abscess.  Plan for ENT consultation to assist in disposition.   2:32 AM Case discussed with Dr. Constance Holster who will see patient in the morning.  He recommends admission for IV antibiotics.  Consult placed to [facility] service.  Patient updated on plan and is agreeable.  Covid not added as patient has a negative PCR from 05/02/19 and 05/04/19.  3:49 AM Dr. Marlowe Sax of [facility] to admit. Additional COVID PCR added for admission screening.    Results for orders placed or performed during the hospital encounter of 05/09/19  CBC with Differential  Result Value Ref Range   WBC 10.8 (H) 4.0 - 10.5 K/uL   RBC 4.14 3.87 - 5.11 MIL/uL   Hemoglobin 11.4 (L) 12.0 - 15.0 g/dL   HCT 35.6 (L) 36.0 - 46.0 %   MCV 86.0 80.0 - 100.0 fL   MCH 27.5 26.0 - 34.0 pg   MCHC 32.0 30.0 - 36.0 g/dL   RDW 14.1 11.5 - 15.5 %   Platelets 331 150 - 400 K/uL   nRBC 0.0 0.0 - 0.2 %   Neutrophils Relative % 65 %   Neutro Abs 6.9 1.7 - 7.7 K/uL   Lymphocytes Relative 25 %   Lymphs Abs 2.7 0.7 - 4.0 K/uL   Monocytes Relative 10 %   Monocytes Absolute 1.0 0.1 - 1.0 K/uL   Eosinophils Relative 0 %   Eosinophils Absolute 0.0 0.0 - 0.5 K/uL   Basophils Relative 0 %   Basophils Absolute 0.0 0.0 - 0.1 K/uL   Immature Granulocytes 0 %   Abs Immature Granulocytes 0.04 0.00 - 0.07 K/uL   Comprehensive metabolic panel  Result Value Ref Range   Sodium 138 135 - 145 mmol/L   Potassium 3.6 3.5 - 5.1 mmol/L   Chloride 104 98 - 111 mmol/L   CO2 25 22 - 32 mmol/L   Glucose, Bld 110 (H) 70 - 99 mg/dL   BUN 11 6 - 20 mg/dL   Creatinine, Ser 0.82 0.44 - 1.00 mg/dL   Calcium 8.6 (L) 8.9 - 10.3 mg/dL   Total Protein 7.0 6.5 - 8.1 g/dL   Albumin 3.1 (L) 3.5 - 5.0 g/dL   AST 15 15 - 41 U/L   ALT 18 0 - 44 U/L   Alkaline Phosphatase 67 38 - 126 U/L   Total Bilirubin <0.1 (L) 0.3 - 1.2 mg/dL   GFR calc non Af Amer >60 >60 mL/min   GFR calc Af Amer >60 >60 mL/min   Anion gap 9 5 - 15   CT Soft Tissue Neck W Contrast  Result Date: 05/10/2019 CLINICAL DATA:  Initial evaluation for acute right-sided neck swelling. EXAM: CT NECK WITH CONTRAST TECHNIQUE: Multidetector CT imaging of the neck was performed using the standard protocol following the bolus administration of intravenous contrast. CONTRAST:  72mL OMNIPAQUE IOHEXOL 300 MG/ML  SOLN COMPARISON:  Prior CT from  05/04/2019. FINDINGS: Pharynx and larynx: Oral cavity within normal limits. No acute abnormality about the dentition. Asymmetric enlargement of the right palatine tonsil, compatible with acute tonsillitis. Superimposed rim enhancing hypodense collection measuring 3.7 x 3.0 x 3.7 cm compatible with tonsillar/peritonsillar abscess (series 3, image 33). Enlarged tonsil is medialized with slight extension across the midline, abutting the left palatine tonsil. Associated induration within the adjacent left parapharyngeal fat, with asymmetric fullness at the left nasopharynx. Mucosal edema noted within the adjacent right oropharynx, compatible with concomitant pharyngitis. No retropharyngeal collection. Epiglottis itself is normal. Vallecula clear. Remainder of the hypopharynx and supraglottic larynx within normal limits. True cords symmetric and normal. Subglottic airway clear. Salivary glands: Salivary glands including the parotid and  submandibular glands are normal. Thyroid: Normal. Lymph nodes: Mildly prominent bilateral level II lymph nodes measure up to 10 mm on the right and 12 mm on the left. Findings likely reactive. No other pathologically enlarged or abnormal adenopathy within the neck. Vascular: Normal intravascular enhancement seen throughout the neck. Limited intracranial: Unremarkable. Visualized orbits: Unremarkable. Mastoids and visualized paranasal sinuses: Visualized paranasal sinuses are clear. Mastoid air cells and middle ear cavities are well pneumatized and free of fluid. Skeleton: Reversal of the normal cervical lordosis. No listhesis. No acute osseous abnormality. No discrete osseous lesions. Upper chest: Visualized upper chest demonstrates no acute finding. Other: None. IMPRESSION: 1. Findings consistent with acute right-sided tonsillitis with associated 3.7 x 3.0 x 3.7 cm right tonsillar/peritonsillar abscess. 2. Mildly prominent bilateral level II lymph nodes, likely reactive. Electronically Signed   By: Rise Mu M.D.   On: 05/10/2019 01:46   CT Soft Tissue Neck W Contrast  Result Date: 05/04/2019 CLINICAL DATA:  Sore throat EXAM: CT NECK WITH CONTRAST TECHNIQUE: Multidetector CT imaging of the neck was performed using the standard protocol following the bolus administration of intravenous contrast. CONTRAST:  57mL OMNIPAQUE IOHEXOL 300 MG/ML  SOLN COMPARISON:  None. FINDINGS: Pharynx and larynx: There is asymmetric right palatine tonsil enlargement. Ill-defined low-attenuation is present, but there is also significant streak artifact and this may be artifactual. Epiglottis is normal. Airway is patent. Salivary glands: Parotid and submandibular glands. Thyroid: Unremarkable. Lymph nodes: There are no enlarged lymph nodes. Vascular: Major neck vessels are patent. Limited intracranial: No abnormal enhancement. Visualized orbits: Unremarkable Mastoids and visualized paranasal sinuses: Aerated Skeleton: Mild  reversal of cervical lordosis.  Normal Upper chest: Included lung apices are clear. Other: None. IMPRESSION: Asymmetric right palatine tonsil enlargement. Ill-defined low attenuation is present, but this may be artifactual and there is no definite abscess. Electronically Signed   By: Guadlupe Spanish M.D.   On: 05/04/2019 15:53      Antony Madura, PA-C 05/10/19 0350    Zadie Rhine, MD 05/10/19 [phone]

## 2019-05-10 NOTE — ED Notes (Signed)
Pt able to swallow pills, says it is painful to swallow pills.  Dr. Hardie Pulley notified and admitting MD notified.

## 2019-05-11 LAB — RENAL FUNCTION PANEL
Albumin: 2.8 g/dL — ABNORMAL LOW (ref 3.5–5.0)
Anion gap: 7 (ref 5–15)
BUN: 6 mg/dL (ref 6–20)
CO2: 24 mmol/L (ref 22–32)
Calcium: 8.4 mg/dL — ABNORMAL LOW (ref 8.9–10.3)
Chloride: 108 mmol/L (ref 98–111)
Creatinine, Ser: 0.66 mg/dL (ref 0.44–1.00)
GFR calc Af Amer: >60 mL/min (ref >60)
GFR calc non Af Amer: >60 mL/min (ref >60)
Glucose, Bld: 91 mg/dL (ref 70–99)
Phosphorus: 3.6 mg/dL (ref 2.5–4.6)
Potassium: 3.9 mmol/L (ref 3.5–5.1)
Sodium: 139 mmol/L (ref 135–145)

## 2019-05-11 LAB — CBC WITH DIFFERENTIAL/PLATELET
Abs Immature Granulocytes: 0.03 10*3/uL (ref 0.00–0.07)
Basophils Absolute: 0 10*3/uL (ref 0.0–0.1)
Basophils Relative: 0 %
Eosinophils Absolute: 0.2 10*3/uL (ref 0.0–0.5)
Eosinophils Relative: 3 %
HCT: 35 % — ABNORMAL LOW (ref 36.0–46.0)
Hemoglobin: 11.3 g/dL — ABNORMAL LOW (ref 12.0–15.0)
Immature Granulocytes: 0 %
Lymphocytes Relative: 25 %
Lymphs Abs: 1.7 10*3/uL (ref 0.7–4.0)
MCH: 27.6 pg (ref 26.0–34.0)
MCHC: 32.3 g/dL (ref 30.0–36.0)
MCV: 85.4 fL (ref 80.0–100.0)
Monocytes Absolute: 0.7 10*3/uL (ref 0.1–1.0)
Monocytes Relative: 10 %
Neutro Abs: 4.2 10*3/uL (ref 1.7–7.7)
Neutrophils Relative %: 62 %
Platelets: 314 10*3/uL (ref 150–400)
RBC: 4.1 MIL/uL (ref 3.87–5.11)
RDW: 14 % (ref 11.5–15.5)
WBC: 6.8 10*3/uL (ref 4.0–10.5)
nRBC: 0 % (ref 0.0–0.2)

## 2019-05-11 LAB — MAGNESIUM: Magnesium: 1.9 mg/dL (ref 1.7–2.4)

## 2019-05-11 MED ORDER — ENOXAPARIN SODIUM 40 MG/0.4ML ~~LOC~~ SOLN
40.0000 mg | SUBCUTANEOUS | Status: DC
  Administered 2019-05-11 – 2019-05-12 (×2): 40 mg via SUBCUTANEOUS
  Filled 2019-05-11 (×2): qty 0.4

## 2019-05-11 NOTE — Progress Notes (Signed)
PROGRESS NOTE    Carolyn Jenkins  JQB:[id number] DOB: [birth year] DOA: 05/09/2019 PCP: [facility]  Outpatient Specialists:   Brief Narrative:  Patient is a 19 year old African-American female admitted to evaluate acute right tonsillitis with associated right tonsillar/peritonsillar abscess.  Apparently, this problem has been going on for a few days.  Compliance with antibiotics may be a problem.  ENT input is highly appreciated.  Patient is currently on IV Unasyn.  05/11/2019: Patient seen.  Patient continues to report throat pain.  No fever or chills.  Patient proceeded that her mother wants the tonsils taken out.  Patient will discuss further with the ENT team.  Will continue antibiotics for now.  Assessment & Plan:   Active Problems:   Peritonsillar abscess   Tonsillitis with exudate  Acute right tonsillitis with right tonsillar/peritonsillar abscess:  -Continue IV antibiotics  -ENT to readdress need for possible tonsillectomy.  Prior ENT documentation is noted vis-a-vis tonsillectomy. -Supportive care. -Further management depend on hospital course  Obesity: Further management on outpatient visit  DVT prophylaxis: Subacute Lovenox Code Status: Full code Family Communication:  Disposition Plan: Home eventually   Consultants:  ENT  Procedures:   None  Antimicrobials:   IV Unasyn   Subjective: No new complaints No fever or chills Throat pain and discomfort persist  Objective: Vitals:   05/10/19 1503 05/10/19 2130 05/11/19 0417 05/11/19 0431  BP: 102/67 128/76 111/68 111/68  Pulse: 89 88 92 92  Resp: 16 17 18    Temp: 98.2 F (36.8 C) 98 F (36.7 C) (!) 97.3 F (36.3 C)   TempSrc: Oral Oral Oral   SpO2: 100% 98% 100% 100%  Weight:        Intake/Output Summary (Last 24 hours) at 05/11/2019 1100 Last data filed at 05/11/2019 0700 Gross per 24 hour  Intake 788.45 ml  Output --  Net 788.45 ml   Filed Weights   05/09/19 1929  Weight: 104 kg     Examination:  General exam: Appears calm and comfortable.  Patient is obese Respiratory system: Clear to auscultation. Respiratory effort normal. Cardiovascular system: S1 & S2 heard, RRR.  Gastrointestinal system: Abdomen is obese, soft and nontender.  Central nervous system: Alert and oriented. No focal neurological deficits. Extremities: No leg edema  Data Reviewed: I have personally reviewed following labs and imaging studies  CBC: Recent Labs  Lab 05/04/19 1427 05/08/19 1836 05/10/19 0148  WBC  --  11.4* 10.8*  NEUTROABS  --  8.6* 6.9  HGB 13.3 12.8 11.4*  HCT 39.0 39.4 35.6*  MCV  --  85.7 86.0  PLT  --  344 409   Basic Metabolic Panel: Recent Labs  Lab 05/04/19 1427 05/08/19 1836 05/10/19 0148  NA 139 139 138  K 4.2 4.1 3.6  CL 105 103 104  CO2  --  24 25  GLUCOSE 105* 94 110*  BUN 4* 7 11  CREATININE 0.60 0.84 0.82  CALCIUM  --  9.1 8.6*   GFR: Estimated Creatinine Clearance: 141.6 mL/min (by C-G formula based on SCr of 0.82 mg/dL). Liver Function Tests: Recent Labs  Lab 05/10/19 0148  AST 15  ALT 18  ALKPHOS 67  BILITOT <0.1*  PROT 7.0  ALBUMIN 3.1*   No results for input(s): LIPASE, AMYLASE in the last 168 hours. No results for input(s): AMMONIA in the last 168 hours. Coagulation Profile: No results for input(s): INR, PROTIME in the last 168 hours. Cardiac Enzymes: No results for input(s): CKTOTAL, CKMB, CKMBINDEX, TROPONINI in  the last 168 hours. BNP (last 3 results) No results for input(s): PROBNP in the last 8760 hours. HbA1C: No results for input(s): HGBA1C in the last 72 hours. CBG: No results for input(s): GLUCAP in the last 168 hours. Lipid Profile: No results for input(s): CHOL, HDL, LDLCALC, TRIG, CHOLHDL, LDLDIRECT in the last 72 hours. Thyroid Function Tests: No results for input(s): TSH, T4TOTAL, FREET4, T3FREE, THYROIDAB in the last 72 hours. Anemia Panel: No results for input(s): VITAMINB12, FOLATE, FERRITIN, TIBC,  IRON, RETICCTPCT in the last 72 hours. Urine analysis: No results found for: COLORURINE, APPEARANCEUR, LABSPEC, PHURINE, GLUCOSEU, HGBUR, BILIRUBINUR, KETONESUR, PROTEINUR, UROBILINOGEN, NITRITE, LEUKOCYTESUR Sepsis Labs: @LABRCNTIP (procalcitonin:4,lacticidven:4)  ) Recent Results (from the past 240 hour(s))  SARS CORONAVIRUS 2 (TAT 6-24 HRS) Nasopharyngeal Throat     Status: None   Collection Time: 05/02/19  2:34 PM   Specimen: Throat; Nasopharyngeal  Result Value Ref Range Status   SARS Coronavirus 2 NEGATIVE NEGATIVE Final    Comment: (NOTE) SARS-CoV-2 target nucleic acids are NOT DETECTED. The SARS-CoV-2 RNA is generally detectable in upper and lower respiratory specimens during the acute phase of infection. Negative results do not preclude SARS-CoV-2 infection, do not rule out co-infections with other pathogens, and should not be used as the sole basis for treatment or other patient management decisions. Negative results must be combined with clinical observations, patient history, and epidemiological information. The expected result is Negative. Fact Sheet for Patients: 06/30/19 Fact Sheet for Healthcare Providers: HairSlick.no This test is not yet approved or cleared by the [place] FDA and  has been authorized for detection and/or diagnosis of SARS-CoV-2 by FDA under an Emergency Use Authorization (EUA). This EUA will remain  in effect (meaning this test can be used) for the duration of the COVID-19 declaration under Section 56 4(b)(1) of the Act, 21 U.S.C. section 360bbb-3(b)(1), unless the authorization is terminated or revoked sooner. Performed at [facility], [address], [place], [place]   Group A Strep by PCR     Status: None   Collection Time: 05/02/19  2:34 PM  Result Value Ref Range Status   Group A Strep by PCR NOT DETECTED NOT DETECTED Final    Comment: Performed at [facility], [address] [place], [place]  Group A Strep by PCR     Status: None   Collection Time: 05/04/19  2:00 PM   Specimen: Nasopharyngeal Swab; Sterile Swab  Result Value Ref Range Status   Group A Strep by PCR NOT DETECTED NOT DETECTED Final    Comment: Performed at [facility], [address], [place], [place]  Novel Coronavirus, NAA (Hosp order, Send-out to Ref Lab; TAT 18-24 hrs     Status: None   Collection Time: 05/04/19  2:00 PM   Specimen: Nasopharyngeal Swab; Respiratory  Result Value Ref Range Status   SARS-CoV-2, NAA NOT DETECTED NOT DETECTED Final    Comment: (NOTE) This nucleic acid amplification test was developed and its performance characteristics determined by 07/02/19. Nucleic acid amplification tests include PCR and TMA. This test has not been FDA cleared or approved. This test has been authorized by FDA under an Emergency Use Authorization (EUA). This test is only authorized for the duration of time the declaration that circumstances exist justifying the authorization of the emergency use of in vitro diagnostic tests for detection of SARS-CoV-2 virus and/or diagnosis of COVID-19 infection under section 564(b)(1) of the Act, 21 U.S.C.  360bbb-3(b) (1), unless the authorization is terminated or revoked sooner. When diagnostic testing is negative, the possibility of a false negative result should be considered in the context of a patient's recent exposures and the presence of clinical signs and symptoms consistent with COVID-19. An individual without symptoms of COVID- 19 and who is not shedding SARS-CoV-2 vi rus would expect to have a negative (not detected) result in this assay. Performed At: [facility] [address] [place], [place] [id number] Jolene Schimke MD YH:[id number]    Coronavirus Source NASOPHARYNGEAL  Final    Comment: Performed at [facility], [address], [place], [place]  [address]  Aerobic/Anaerobic Culture (surgical/deep wound)     Status: None (Preliminary result)   Collection Time: 05/08/19  9:00 PM   Specimen: Abscess  Result Value Ref Range Status   Specimen Description ABSCESS RIGHT TONSIL  Final   Special Requests NONE  Final   Gram Stain   Final    ABUNDANT WBC PRESENT, PREDOMINANTLY PMN ABUNDANT GRAM POSITIVE COCCI MODERATE GRAM VARIABLE ROD Performed at [facility] [place] [facility], [address], [place], [place] [address]    Culture   Final    FEW STREPTOCOCCUS INTERMEDIUS NO ANAEROBES ISOLATED; CULTURE IN PROGRESS FOR 5 DAYS    Report Status PENDING  Incomplete   Organism ID, Bacteria STREPTOCOCCUS INTERMEDIUS  Final      Susceptibility   Streptococcus intermedius - MIC*    PENICILLIN <=0.06 SENSITIVE Sensitive     CEFTRIAXONE <=0.12 SENSITIVE Sensitive     ERYTHROMYCIN 4 RESISTANT Resistant     LEVOFLOXACIN 0.5 SENSITIVE Sensitive     VANCOMYCIN 0.5 SENSITIVE Sensitive     * FEW STREPTOCOCCUS INTERMEDIUS  SARS CORONAVIRUS 2 (TAT 6-24 HRS) Nasopharyngeal Nasopharyngeal Swab     Status: None   Collection Time: 05/10/19  3:57 AM   Specimen: Nasopharyngeal Swab  Result Value Ref Range Status   SARS Coronavirus 2 NEGATIVE NEGATIVE Final    Comment: (NOTE) SARS-CoV-2 target nucleic acids are NOT DETECTED. The SARS-CoV-2 RNA is generally detectable in upper and lower respiratory specimens during the acute phase of infection. Negative results do not preclude SARS-CoV-2 infection, do not rule out co-infections with other pathogens, and should not be used as the sole basis for treatment or other patient management decisions. Negative results must be combined with clinical observations, patient history, and epidemiological information. The expected result is Negative. Fact Sheet for Patients: HairSlick.no Fact Sheet for Healthcare Providers: quierodirigir.com This test is not yet approved or  cleared by the [place] FDA and  has been authorized for detection and/or diagnosis of SARS-CoV-2 by FDA under an Emergency Use Authorization (EUA). This EUA will remain  in effect (meaning this test can be used) for the duration of the COVID-19 declaration under Section 56 4(b)(1) of the Act, 21 U.S.C. section 360bbb-3(b)(1), unless the authorization is terminated or revoked sooner. Performed at [facility], [address], [place], [place] [address]          Radiology Studies: CT Soft Tissue Neck W Contrast  Result Date: 05/10/2019 CLINICAL DATA:  Initial evaluation for acute right-sided neck swelling. EXAM: CT NECK WITH CONTRAST TECHNIQUE: Multidetector CT imaging of the neck was performed using the standard protocol following the bolus administration of intravenous contrast. CONTRAST:  45mL OMNIPAQUE IOHEXOL 300 MG/ML  SOLN COMPARISON:  Prior CT from 05/04/2019. FINDINGS: Pharynx and larynx: Oral cavity within normal limits. No acute abnormality about the dentition. Asymmetric enlargement of  the right palatine tonsil, compatible with acute tonsillitis. Superimposed rim enhancing hypodense collection measuring 3.7 x 3.0 x 3.7 cm compatible with tonsillar/peritonsillar abscess (series 3, image 33). Enlarged tonsil is medialized with slight extension across the midline, abutting the left palatine tonsil. Associated induration within the adjacent left parapharyngeal fat, with asymmetric fullness at the left nasopharynx. Mucosal edema noted within the adjacent right oropharynx, compatible with concomitant pharyngitis. No retropharyngeal collection. Epiglottis itself is normal. Vallecula clear. Remainder of the hypopharynx and supraglottic larynx within normal limits. True cords symmetric and normal. Subglottic airway clear. Salivary glands: Salivary glands including the parotid and submandibular glands are normal. Thyroid: Normal. Lymph nodes: Mildly prominent bilateral level II lymph  nodes measure up to 10 mm on the right and 12 mm on the left. Findings likely reactive. No other pathologically enlarged or abnormal adenopathy within the neck. Vascular: Normal intravascular enhancement seen throughout the neck. Limited intracranial: Unremarkable. Visualized orbits: Unremarkable. Mastoids and visualized paranasal sinuses: Visualized paranasal sinuses are clear. Mastoid air cells and middle ear cavities are well pneumatized and free of fluid. Skeleton: Reversal of the normal cervical lordosis. No listhesis. No acute osseous abnormality. No discrete osseous lesions. Upper chest: Visualized upper chest demonstrates no acute finding. Other: None. IMPRESSION: 1. Findings consistent with acute right-sided tonsillitis with associated 3.7 x 3.0 x 3.7 cm right tonsillar/peritonsillar abscess. 2. Mildly prominent bilateral level II lymph nodes, likely reactive. Electronically Signed   By: Rise Mu M.D.   On: 05/10/2019 01:46        Scheduled Meds: Continuous Infusions:  ampicillin-sulbactam (UNASYN) IV 3 g (05/11/19 0845)     LOS: 1 day    Time spent: 25 Minutes    Berton Mount, MD  [facility] Pager #: [phone] 7PM-7AM contact night coverage as above

## 2019-05-12 MED ORDER — CLINDAMYCIN HCL 300 MG PO CAPS: 300.0000 mg | ORAL_CAPSULE | Freq: Three times a day (TID) | ORAL | 0 refills | Status: DC

## 2019-05-12 MED ORDER — HYDROCODONE-ACETAMINOPHEN 5-325 MG PO TABS: 1.0000 | ORAL_TABLET | ORAL | 0 refills | Status: AC | PRN

## 2019-05-12 NOTE — Plan of Care (Signed)
Pt for discharge going home, alert and oriented, ambulatory, tolerates her meal, given pain med 2x, discontinued peripheral IV line, given health teachings, next appointment, due med  also prescription given for pain explained and understood, waiting for her family to pick her up, also given all her personal belongings.

## 2019-05-12 NOTE — Discharge Summary (Signed)
Discharge Summary  Carolyn Jenkins AVW:[id number] DOB: 09/19/2000  [facility]  Admit date: 05/09/2019 Discharge date: 05/12/2019   Recommendations for Outpatient Follow-up:  1. PCP 2 weeks 2. ENT Dr. Pollyann Kennedyosen as outpatient in one week.   Discharge Diagnoses:  Active Hospital Problems   Diagnosis Date Noted  . Peritonsillar abscess 05/10/2019  . Tonsillitis with exudate 05/10/2019    Resolved Hospital Problems  No resolved problems to display.    Discharge Condition: Stable   Diet recommendation: Regular   Vitals:   05/12/19 0446 05/12/19 0800  BP: 114/60   Pulse: 89   Resp: 16   Temp: 98 F (36.7 C) 99.3 F (37.4 C)  SpO2: 100%     HPI and Brief Hospital Course:  Carolyn Jenkins is a 19 y.o. female with no significant medical history presenting with sore throat.  She has eben having upper chest and R ear/facial pain.  She was told it was URI.  The other day they said it was a tonsillar infection and they drained out the mucus day before ysterday.  She could feel the pressure building up again yesterday and came back.  Temp 98-99.  +pain with swallowing.  Her other contacts in her house are also sick.  They have not been tested for COVID.  She was COVID negative on 1/9 and 1/11.  She was prescribed Clinda on 1/15 but has only taken 1 dose.  Seen by Dr. Pollyann Kennedyosen while on UnaSyn, evaluated again today 1/19 he recommends should improve with PO Clinda and OK for DC as long as tolerating PO. Seen and examined this AM and she says pain is controlled, able to swallow pills, denies worsening pain or trouble swallowing or fevers and is agreeable to going home. Discussed with her to call her MD or return to ER incase of worsening fevers, pain or shortness of breath.   Discharge details, plan of care and follow up instructions were discussed with patient and any available family or care providers. Patient and family are in agreement with discharge from the hospital today and  all questions were answered to their satisfaction.  Consultations:  ENT   Discharge Exam: BP 114/60 (BP Location: Right Arm)   Pulse 89   Temp 99.3 F (37.4 C) (Oral)   Resp 16   Wt 104 kg   SpO2 100%   BMI 33.86 kg/m  General:  Alert, oriented, calm, in no acute distress  Eyes: EOMI, clear sclerea Neck: supple, no masses, mininally tender, trachea mildline, no stridor, able to manage her secretions Cardiovascular: RRR, no murmurs or rubs, no peripheral edema  Respiratory: clear to auscultation bilaterally, no wheezes, no crackles  Abdomen: soft, nontender, nondistended, normal bowel tones heard  Skin: dry, no rashes  Musculoskeletal: no joint effusions, normal range of motion  Psychiatric: appropriate affect, normal speech  Neurologic: extraocular muscles intact, clear speech, moving all extremities with intact sensorium    Discharge Instructions You were cared for by a hospitalist during your hospital stay. If you have any questions about your discharge medications or the care you received while you were in the hospital after you are discharged, you can call the unit and asked to speak with the hospitalist on call if the hospitalist that took care of you is not available. Once you are discharged, your primary care physician will handle any further medical issues. Please note that NO REFILLS for any discharge medications will be authorized once you are discharged, as it is imperative that  you return to your primary care physician (or establish a relationship with a primary care physician if you do not have one) for your aftercare needs so that they can reassess your need for medications and monitor your lab values.  Discharge Instructions    Call MD for:  severe uncontrolled pain   Complete by: As directed    Call MD for:  temperature >100.4   Complete by: As directed    Diet - low sodium heart healthy   Complete by: As directed    Increase activity slowly   Complete by: As  directed      Allergies as of 05/12/2019   No Known Allergies     Medication List    STOP taking these medications   lidocaine 2 % solution Commonly known as: XYLOCAINE   naproxen 500 MG tablet Commonly known as: NAPROSYN     TAKE these medications   acetaminophen 325 MG tablet Commonly known as: TYLENOL Take 650 mg by mouth every 6 (six) hours as needed for moderate pain.   clindamycin 300 MG capsule Commonly known as: CLEOCIN Take 1 capsule (300 mg total) by mouth 3 (three) times daily for 10 days.   HYDROcodone-acetaminophen 5-325 MG tablet Commonly known as: NORCO/VICODIN Take 1 tablet by mouth every 4 (four) hours as needed for up to 3 days for severe pain.   ibuprofen 200 MG tablet Commonly known as: ADVIL Take 400 mg by mouth every 6 (six) hours as needed for mild pain or moderate pain.      No Known Allergies Follow-up Information    Call  [name], MD.   Specialty: Otolaryngology Why: if not improving for an appointment this week. Contact information: [address] [place] [address] [phone]            The results of significant diagnostics from this hospitalization (including imaging, microbiology, ancillary and laboratory) are listed below for reference.    Significant Diagnostic Studies: CT Soft Tissue Neck W Contrast  Result Date: 05/10/2019 CLINICAL DATA:  Initial evaluation for acute right-sided neck swelling. EXAM: CT NECK WITH CONTRAST TECHNIQUE: Multidetector CT imaging of the neck was performed using the standard protocol following the bolus administration of intravenous contrast. CONTRAST:  26mL OMNIPAQUE IOHEXOL 300 MG/ML  SOLN COMPARISON:  Prior CT from 05/04/2019. FINDINGS: Pharynx and larynx: Oral cavity within normal limits. No acute abnormality about the dentition. Asymmetric enlargement of the right palatine tonsil, compatible with acute tonsillitis. Superimposed rim enhancing hypodense collection  measuring 3.7 x 3.0 x 3.7 cm compatible with tonsillar/peritonsillar abscess (series 3, image 33). Enlarged tonsil is medialized with slight extension across the midline, abutting the left palatine tonsil. Associated induration within the adjacent left parapharyngeal fat, with asymmetric fullness at the left nasopharynx. Mucosal edema noted within the adjacent right oropharynx, compatible with concomitant pharyngitis. No retropharyngeal collection. Epiglottis itself is normal. Vallecula clear. Remainder of the hypopharynx and supraglottic larynx within normal limits. True cords symmetric and normal. Subglottic airway clear. Salivary glands: Salivary glands including the parotid and submandibular glands are normal. Thyroid: Normal. Lymph nodes: Mildly prominent bilateral level II lymph nodes measure up to 10 mm on the right and 12 mm on the left. Findings likely reactive. No other pathologically enlarged or abnormal adenopathy within the neck. Vascular: Normal intravascular enhancement seen throughout the neck. Limited intracranial: Unremarkable. Visualized orbits: Unremarkable. Mastoids and visualized paranasal sinuses: Visualized paranasal sinuses are clear. Mastoid air cells and middle ear cavities are well pneumatized  and free of fluid. Skeleton: Reversal of the normal cervical lordosis. No listhesis. No acute osseous abnormality. No discrete osseous lesions. Upper chest: Visualized upper chest demonstrates no acute finding. Other: None. IMPRESSION: 1. Findings consistent with acute right-sided tonsillitis with associated 3.7 x 3.0 x 3.7 cm right tonsillar/peritonsillar abscess. 2. Mildly prominent bilateral level II lymph nodes, likely reactive. Electronically Signed   By: Jeannine Boga M.D.   On: 05/10/2019 01:46   CT Soft Tissue Neck W Contrast  Result Date: 05/04/2019 CLINICAL DATA:  Sore throat EXAM: CT NECK WITH CONTRAST TECHNIQUE: Multidetector CT imaging of the neck was performed using the  standard protocol following the bolus administration of intravenous contrast. CONTRAST:  53mL OMNIPAQUE IOHEXOL 300 MG/ML  SOLN COMPARISON:  None. FINDINGS: Pharynx and larynx: There is asymmetric right palatine tonsil enlargement. Ill-defined low-attenuation is present, but there is also significant streak artifact and this may be artifactual. Epiglottis is normal. Airway is patent. Salivary glands: Parotid and submandibular glands. Thyroid: Unremarkable. Lymph nodes: There are no enlarged lymph nodes. Vascular: Major neck vessels are patent. Limited intracranial: No abnormal enhancement. Visualized orbits: Unremarkable Mastoids and visualized paranasal sinuses: Aerated Skeleton: Mild reversal of cervical lordosis.  Normal Upper chest: Included lung apices are clear. Other: None. IMPRESSION: Asymmetric right palatine tonsil enlargement. Ill-defined low attenuation is present, but this may be artifactual and there is no definite abscess. Electronically Signed   By: Macy Mis M.D.   On: 05/04/2019 15:53    Microbiology: Recent Results (from the past 240 hour(s))  SARS CORONAVIRUS 2 (TAT 6-24 HRS) Nasopharyngeal Throat     Status: None   Collection Time: 05/02/19  2:34 PM   Specimen: Throat; Nasopharyngeal  Result Value Ref Range Status   SARS Coronavirus 2 NEGATIVE NEGATIVE Final    Comment: (NOTE) SARS-CoV-2 target nucleic acids are NOT DETECTED. The SARS-CoV-2 RNA is generally detectable in upper and lower respiratory specimens during the acute phase of infection. Negative results do not preclude SARS-CoV-2 infection, do not rule out co-infections with other pathogens, and should not be used as the sole basis for treatment or other patient management decisions. Negative results must be combined with clinical observations, patient history, and epidemiological information. The expected result is Negative. Fact Sheet for Patients: SugarRoll.be Fact Sheet for  Healthcare Providers: https://www.woods-mathews.com/ This test is not yet approved or cleared by the [place] FDA and  has been authorized for detection and/or diagnosis of SARS-CoV-2 by FDA under an Emergency Use Authorization (EUA). This EUA will remain  in effect (meaning this test can be used) for the duration of the COVID-19 declaration under Section 56 4(b)(1) of the Act, 21 U.S.C. section 360bbb-3(b)(1), unless the authorization is terminated or revoked sooner. Performed at [facility], [place] [address] [place], [place] [address]   Group A Strep by PCR     Status: None   Collection Time: 05/02/19  2:34 PM  Result Value Ref Range Status   Group A Strep by PCR NOT DETECTED NOT DETECTED Final    Comment: Performed at [facility], [place] [address] [place], [place] [address]  Group A Strep by PCR     Status: None   Collection Time: 05/04/19  2:00 PM   Specimen: Nasopharyngeal Swab; Sterile Swab  Result Value Ref Range Status   Group A Strep by PCR NOT DETECTED NOT DETECTED Final    Comment: Performed at [facility], [place] [address] [place], [place] [address]  Novel  Coronavirus, NAA (Hosp order, Send-out to Ref Lab; TAT 18-24 hrs     Status: None   Collection Time: 05/04/19  2:00 PM   Specimen: Nasopharyngeal Swab; Respiratory  Result Value Ref Range Status   SARS-CoV-2, NAA NOT DETECTED NOT DETECTED Final    Comment: (NOTE) This nucleic acid amplification test was developed and its performance characteristics determined by [employer]. Nucleic acid amplification tests include PCR and TMA. This test has not been FDA cleared or approved. This test has been authorized by FDA under an Emergency Use Authorization (EUA). This test is only authorized for the duration of time the declaration that circumstances exist justifying the authorization of the emergency use of in vitro diagnostic tests for detection of SARS-CoV-2 virus  and/or diagnosis of COVID-19 infection under section 564(b)(1) of the Act, 21 U.S.C. 098JXB-1(Y) (1), unless the authorization is terminated or revoked sooner. When diagnostic testing is negative, the possibility of a false negative result should be considered in the context of a patient's recent exposures and the presence of clinical signs and symptoms consistent with COVID-19. An individual without symptoms of COVID- 19 and who is not shedding SARS-CoV-2 vi rus would expect to have a negative (not detected) result in this assay. Performed At: [facility] [address] [place], [place] [id number] Jolene Schimke MD YQ:[id number]    Coronavirus Source NASOPHARYNGEAL  Final    Comment: Performed at [facility], [address] [place], [place] [address]  Aerobic/Anaerobic Culture (surgical/deep wound)     Status: None (Preliminary result)   Collection Time: 05/08/19  9:00 PM   Specimen: Abscess  Result Value Ref Range Status   Specimen Description ABSCESS RIGHT TONSIL  Final   Special Requests NONE  Final   Gram Stain   Final    ABUNDANT WBC PRESENT, PREDOMINANTLY PMN ABUNDANT GRAM POSITIVE COCCI MODERATE GRAM VARIABLE ROD Performed at [facility], [address], [place], [place] [address]    Culture   Final    FEW STREPTOCOCCUS INTERMEDIUS NO ANAEROBES ISOLATED; CULTURE IN PROGRESS FOR 5 DAYS    Report Status PENDING  Incomplete   Organism ID, Bacteria STREPTOCOCCUS INTERMEDIUS  Final      Susceptibility   Streptococcus intermedius - MIC*    PENICILLIN <=0.06 SENSITIVE Sensitive     CEFTRIAXONE <=0.12 SENSITIVE Sensitive     ERYTHROMYCIN 4 RESISTANT Resistant     LEVOFLOXACIN 0.5 SENSITIVE Sensitive     VANCOMYCIN 0.5 SENSITIVE Sensitive     * FEW STREPTOCOCCUS INTERMEDIUS  SARS CORONAVIRUS 2 (TAT 6-24 HRS) Nasopharyngeal Nasopharyngeal Swab     Status: None   Collection Time: 05/10/19  3:57 AM   Specimen: Nasopharyngeal Swab  Result Value Ref  Range Status   SARS Coronavirus 2 NEGATIVE NEGATIVE Final    Comment: (NOTE) SARS-CoV-2 target nucleic acids are NOT DETECTED. The SARS-CoV-2 RNA is generally detectable in upper and lower respiratory specimens during the acute phase of infection. Negative results do not preclude SARS-CoV-2 infection, do not rule out co-infections with other pathogens, and should not be used as the sole basis for treatment or other patient management decisions. Negative results must be combined with clinical observations, patient history, and epidemiological information. The expected result is Negative. Fact Sheet for Patients: HairSlick.no Fact Sheet for Healthcare Providers: quierodirigir.com This test is not yet approved or cleared by the [place] FDA and  has been authorized for detection and/or diagnosis of SARS-CoV-2 by FDA under an Emergency Use Authorization (EUA).  This EUA will remain  in effect (meaning this test can be used) for the duration of the COVID-19 declaration under Section 56 4(b)(1) of the Act, 21 U.S.C. section 360bbb-3(b)(1), unless the authorization is terminated or revoked sooner. Performed at [facility], [address], [place], [place] [address]      Labs: Basic Metabolic Panel: Recent Labs  Lab 05/08/19 1836 05/10/19 0148 05/11/19 1051  NA 139 138 139  K 4.1 3.6 3.9  CL 103 104 108  CO2 24 25 24   GLUCOSE 94 110* 91  BUN 7 11 6   CREATININE 0.84 0.82 0.66  CALCIUM 9.1 8.6* 8.4*  MG  --   --  1.9  PHOS  --   --  3.6   Liver Function Tests: Recent Labs  Lab 05/10/19 0148 05/11/19 1051  AST 15  --   ALT 18  --   ALKPHOS 67  --   BILITOT <0.1*  --   PROT 7.0  --   ALBUMIN 3.1* 2.8*   No results for input(s): LIPASE, AMYLASE in the last 168 hours. No results for input(s): AMMONIA in the last 168 hours. CBC: Recent Labs  Lab 05/08/19 1836 05/10/19 0148 05/11/19 1051  WBC 11.4* 10.8*  6.8  NEUTROABS 8.6* 6.9 4.2  HGB 12.8 11.4* 11.3*  HCT 39.4 35.6* 35.0*  MCV 85.7 86.0 85.4  PLT 344 331 314   Cardiac Enzymes: No results for input(s): CKTOTAL, CKMB, CKMBINDEX, TROPONINI in the last 168 hours. BNP: BNP (last 3 results) No results for input(s): BNP in the last 8760 hours.  ProBNP (last 3 results) No results for input(s): PROBNP in the last 8760 hours.  CBG: No results for input(s): GLUCAP in the last 168 hours.  Time spent: 32 minutes were spent in preparing this discharge including medication reconciliation, counseling, and coordination of care.  Signed:  Nicolena Schurman 05/12/19, MD  [facility] 05/12/2019, 12:27 PM

## 2019-05-12 NOTE — Progress Notes (Signed)
Pt discharged ambulatory accompanied by her mother.

## 2019-05-12 NOTE — Progress Notes (Signed)
Patient ID: Carolyn Jenkins, female   DOB: Sep 22, 2000, 19 y.o.   MRN: [id number] Subjective: Follow-up, she continues to complain of pain and inability to swallow.  She claims to have drank only a few ounces of water yesterday.  She is lying down breathing well and seems to be very comfortable.  She has requested that I speak with her mother over the phone.  Objective: Vital signs in last 24 hours: Temp:  [98 F (36.7 C)-99.3 F (37.4 C)] 99.3 F (37.4 C) (01/19 0800) Pulse Rate:  [89-91] 89 (01/19 0446) Resp:  [16] 16 (01/19 0446) BP: (114-134)/(43-63) 114/60 (01/19 0446) SpO2:  [99 %-100 %] 100 % (01/19 0446) Weight change:  Last BM Date: 05/10/19  Intake/Output from previous day: No intake/output data recorded. Intake/Output this shift: No intake/output data recorded.  PHYSICAL EXAM: She is awake and alert, breathing without difficulty.  She is having no difficulty with secretions.  She has no trismus.  There is no palpable adenopathy although she complains of pain when I palpate her neck.  Oropharynx reveals persistent fullness of the right tonsil.  Lab Results: Recent Labs    05/10/19 0148 05/11/19 1051  WBC 10.8* 6.8  HGB 11.4* 11.3*  HCT 35.6* 35.0*  PLT 331 314   BMET Recent Labs    05/10/19 0148 05/11/19 1051  NA 138 139  K 3.6 3.9  CL 104 108  CO2 25 24  GLUCOSE 110* 91  BUN 11 6  CREATININE 0.82 0.66  CALCIUM 8.6* 8.4*    Studies/Results: No results found.  Medications: I have reviewed the patient's current medications.  Assessment/Plan: Tonsillitis/tonsillar abscess.  Without trismus or difficulty swallowing, again, this is unusual for a peritonsillar abscess and based on the CT findings, the peritonsillar plane appears to be clear.  White count has normalized as well.  She appears very comfortable and does not appear to be in pain although she complains of a lot of pain.  There is 300 cc of oral intake listed third shift yesterday but no other intake  has been recorded.  I spoke to her mother over the phone with her present.  I explained that if she can drink adequate amounts of liquids she can go home on oral clindamycin.  If she is unable to then we will revisit reevaluation tomorrow and consider if tonsillectomy or repeat drainage may be indicated.  More than likely this will clear with oral antibiotics.  LOS: 2 days   [name] 05/12/2019, 8:52 AM

## 2019-05-14 LAB — AEROBIC/ANAEROBIC CULTURE W GRAM STAIN (SURGICAL/DEEP WOUND)

## 2019-05-15 ENCOUNTER — Telehealth: Payer: Self-pay | Admitting: *Deleted

## 2019-05-15 NOTE — Telephone Encounter (Signed)
Post ED Visit - Positive Culture Follow-up  Culture report reviewed by antimicrobial stewardship pharmacist: [facility] []  , Pharm.D. []  Enzo Bi, Pharm.D., BCPS AQ-ID []  , Pharm.D., BCPS []  Celedonio Miyamoto, Pharm.D., BCPS []  Jarales, Garvin Fila.D., BCPS, AAHIVP []  , Pharm.D., BCPS, AAHIVP [x]  Georgina Pillion, PharmD, BCPS []  , PharmD, BCPS []  [place], PharmD, BCPS []  [place], PharmD []  , PharmD, BCPS []  Estella Husk, PharmD  Pharmacy Team []  Lysle Pearl, PharmD []  , PharmD []  Phillips Climes, PharmD []  , Rph []  Agapito Games) , PharmD []  Verlan Friends, PharmD []  , PharmD []  Mervyn Gay, PharmD []  , PharmD []  Vinnie Level, PharmD []  [facility], PharmD []  , PharmD []  Len Childs, PharmD   Positive wound culture Treated with Clindamycin HCL, organism sensitive to the same and no further patient follow-up is required at this time.  [facility] 05/15/2019, 10:41 AM

## 2019-05-22 ENCOUNTER — Other Ambulatory Visit: Payer: Self-pay

## 2019-05-22 ENCOUNTER — Encounter (HOSPITAL_COMMUNITY): Payer: Self-pay | Admitting: Emergency Medicine

## 2019-05-22 ENCOUNTER — Emergency Department (HOSPITAL_COMMUNITY)
Admission: EM | Admit: 2019-05-22 | Discharge: 2019-05-22 | Disposition: A | Discharge status: Home / Self Care | Payer: BC Managed Care – PPO | Attending: Emergency Medicine | Admitting: Emergency Medicine

## 2019-05-22 DIAGNOSIS — R519 Headache, unspecified: Secondary | ICD-10-CM | POA: Insufficient documentation

## 2019-05-22 DIAGNOSIS — J029 Acute pharyngitis, unspecified: Secondary | ICD-10-CM | POA: Diagnosis present

## 2019-05-22 DIAGNOSIS — F17290 Nicotine dependence, other tobacco product, uncomplicated: Secondary | ICD-10-CM | POA: Diagnosis not present

## 2019-05-22 DIAGNOSIS — J039 Acute tonsillitis, unspecified: Secondary | ICD-10-CM | POA: Diagnosis not present

## 2019-05-22 LAB — CBC
HCT: 36.2 % (ref 36.0–46.0)
Hemoglobin: 12 g/dL (ref 12.0–15.0)
MCH: 27.9 pg (ref 26.0–34.0)
MCHC: 33.1 g/dL (ref 30.0–36.0)
MCV: 84.2 fL (ref 80.0–100.0)
Platelets: 308 10*3/uL (ref 150–400)
RBC: 4.3 MIL/uL (ref 3.87–5.11)
RDW: 14.6 % (ref 11.5–15.5)
WBC: 5.8 10*3/uL (ref 4.0–10.5)
nRBC: 0 % (ref 0.0–0.2)

## 2019-05-22 LAB — BASIC METABOLIC PANEL
Anion gap: 6 (ref 5–15)
BUN: 7 mg/dL (ref 6–20)
CO2: 24 mmol/L (ref 22–32)
Calcium: 8.7 mg/dL — ABNORMAL LOW (ref 8.9–10.3)
Chloride: 109 mmol/L (ref 98–111)
Creatinine, Ser: 0.65 mg/dL (ref 0.44–1.00)
GFR calc Af Amer: >60 mL/min (ref >60)
GFR calc non Af Amer: >60 mL/min (ref >60)
Glucose, Bld: 117 mg/dL — ABNORMAL HIGH (ref 70–99)
Potassium: 3.7 mmol/L (ref 3.5–5.1)
Sodium: 139 mmol/L (ref 135–145)

## 2019-05-22 LAB — GROUP A STREP BY PCR: Group A Strep by PCR: NOT DETECTED

## 2019-05-22 MED ORDER — HYDROCODONE-ACETAMINOPHEN 5-325 MG PO TABS: ORAL_TABLET | ORAL | 0 refills | Status: DC

## 2019-05-22 MED ORDER — CLINDAMYCIN HCL 150 MG PO CAPS: 300.0000 mg | ORAL_CAPSULE | Freq: Four times a day (QID) | ORAL | 0 refills | Status: DC

## 2019-05-22 MED ORDER — NAPROXEN 500 MG PO TABS: 500.0000 mg | ORAL_TABLET | Freq: Two times a day (BID) | ORAL | 0 refills | Status: DC

## 2019-05-22 MED ORDER — DEXAMETHASONE SODIUM PHOSPHATE 10 MG/ML IJ SOLN
10.0000 mg | Freq: Once | INTRAMUSCULAR | Status: AC
  Administered 2019-05-22: 10 mg via INTRAVENOUS
  Filled 2019-05-22: qty 1

## 2019-05-22 MED ORDER — KETOROLAC TROMETHAMINE 15 MG/ML IJ SOLN
15.0000 mg | Freq: Once | INTRAMUSCULAR | Status: AC
  Administered 2019-05-22: 22:00:00 15 mg via INTRAVENOUS
  Filled 2019-05-22: qty 1

## 2019-05-22 MED ORDER — CLINDAMYCIN PHOSPHATE 600 MG/50ML IV SOLN
600.0000 mg | Freq: Once | INTRAVENOUS | Status: AC
  Administered 2019-05-22: 600 mg via INTRAVENOUS
  Filled 2019-05-22: qty 50

## 2019-05-22 MED ORDER — SODIUM CHLORIDE 0.9 % IV BOLUS
1000.0000 mL | Freq: Once | INTRAVENOUS | Status: AC
  Administered 2019-05-22: 1000 mL via INTRAVENOUS

## 2019-05-22 NOTE — ED Provider Notes (Signed)
[address] [facility] EMERGENCY DEPARTMENT Provider Note   CSN: [id number] Arrival date & time: 05/22/19  2041     History Chief Complaint  Patient presents with  . Sore Throat    Carolyn Jenkins is a 19 y.o. female.  Patient with history of right-sided tonsillar abscess/tonsilitis ongoing over the past 3 weeks, s/p drainage in the ED, followed by admission to the hospital for IV antibiotics and consultation by Dr. Pollyann Kennedy -- presents to the emergency department with recurrent sore throat.  Patient states that she was feeling better after being discharged from the hospital.  She denies being sent home on any antibiotics but has been using pain medication.  Patient states that yesterday she began having similar symptoms again, however this time on the left side.  She reports decreased oral intake due to pain.  She denies fever.  No nausea or vomiting.  She presents tonight requesting to have her tonsils out.  She has not followed up with Dr. Pollyann Kennedy since being discharged.        History reviewed. No pertinent past medical history.  Patient Active Problem List   Diagnosis Date Noted  . Peritonsillar abscess 05/10/2019  . Tonsillitis with exudate 05/10/2019    History reviewed. No pertinent surgical history.   OB History   No obstetric history on file.     No family history on file.  Social History   Tobacco Use  . Smoking status: Current Every Day Smoker    Types: Cigars  . Smokeless tobacco: Never Used  . Tobacco comment: 5 per day, Black and Milds  Substance Use Topics  . Alcohol use: No  . Drug use: Never    Home Medications Prior to Admission medications   Medication Sig Start Date End Date Taking? Authorizing Provider  acetaminophen (TYLENOL) 325 MG tablet Take 650 mg by mouth every 6 (six) hours as needed for moderate pain.    [provider]  clindamycin (CLEOCIN) 300 MG capsule Take 1 capsule (300 mg total) by mouth 3 (three) times daily for  10 days. 05/12/19 05/22/19  Kirby Crigler, Mir Shirline Frees, MD  ibuprofen (ADVIL) 200 MG tablet Take 400 mg by mouth every 6 (six) hours as needed for mild pain or moderate pain.    [provider]    Allergies    Patient has no known allergies.  Review of Systems   Review of Systems  Constitutional: Negative for chills and fever.  HENT: Positive for sore throat and trouble swallowing. Negative for rhinorrhea.   Eyes: Negative for redness.  Respiratory: Negative for cough.   Cardiovascular: Negative for chest pain.  Gastrointestinal: Negative for abdominal pain, diarrhea, nausea and vomiting.  Genitourinary: Negative for dysuria.  Musculoskeletal: Negative for myalgias.  Skin: Negative for rash.  Neurological: Positive for headaches.    Physical Exam Updated Vital Signs BP 129/83 (BP Location: Left Arm)   Pulse (!) 114   Temp 98.2 F (36.8 C) (Oral)   Resp 18   Ht 5\' 9"  (1.753 m)   Wt 99.8 kg   SpO2 96%   BMI 32.49 kg/m   Physical Exam Vitals and nursing note reviewed.  Constitutional:      Appearance: She is well-developed.  HENT:     Head: Normocephalic and atraumatic.     Mouth/Throat:     Mouth: Mucous membranes are moist.     Pharynx: No pharyngeal swelling, oropharyngeal exudate or posterior oropharyngeal erythema.     Tonsils: Tonsillar exudate present. No tonsillar  abscesses. 3+ on the right. 3+ on the left.     Comments: Both tonsils appear inflamed, exudates more prominent on the left.  No signs of peritonsillar abscess.  No obvious tonsillar abscess.  Exam is consistent with tonsillitis. Eyes:     General:        Right eye: No discharge.        Left eye: No discharge.     Conjunctiva/sclera: Conjunctivae normal.  Cardiovascular:     Rate and Rhythm: Normal rate and regular rhythm.     Heart sounds: Normal heart sounds.  Pulmonary:     Effort: Pulmonary effort is normal.     Breath sounds: Normal breath sounds.  Abdominal:     Palpations: Abdomen is  soft.     Tenderness: There is no abdominal tenderness.  Musculoskeletal:     Cervical back: Full passive range of motion without pain, normal range of motion and neck supple.  Skin:    General: Skin is warm and dry.  Neurological:     Mental Status: She is alert.     ED Results / Procedures / Treatments   Labs (all labs ordered are listed, but only abnormal results are displayed) Labs Reviewed  GROUP A STREP BY PCR  CBC  BASIC METABOLIC PANEL    EKG None  Radiology No results found.  Procedures Procedures (including critical care time)  Medications Ordered in ED Medications  sodium chloride 0.9 % bolus 1,000 mL (has no administration in time range)  ketorolac (TORADOL) 15 MG/ML injection 15 mg (has no administration in time range)  dexamethasone (DECADRON) injection 10 mg (has no administration in time range)  clindamycin (CLEOCIN) IVPB 600 mg (has no administration in time range)    ED Course  I have reviewed the triage vital signs and the nursing notes.  Pertinent labs & imaging results that were available during my care of the patient were reviewed by me and considered in my medical decision making (see chart for details).  Patient seen and examined.  Patient has what appears to be recurrent tonsillitis.  I do not see any significant abscess and patient does not have any concerning findings such as trismus.  She appears to be in some pain but is in no distress.  I feel that it would be reasonable to give her IV hydration, a dose of IV Clinda, Decadron, and Toradol and reassess her.  I discussed with the patient that I do not think that she will be admitted for emergent tonsillectomy.  I discussed that she may indeed need this, however will need to follow-up as an outpatient with Dr. Constance Holster to discuss this further.  Vital signs reviewed and are as follows: BP 129/83 (BP Location: Left Arm)   Pulse (!) 114   Temp 98.2 F (36.8 C) (Oral)   Resp 18   Ht 5\' 9"  (1.753  m)   Wt 99.8 kg   SpO2 96%   BMI 32.49 kg/m   9:48 PM Signout to [employer] at shift change.  Will need reassessment.  Home with antibiotics, pain medication.    MDM Rules/Calculators/A&P                      Patient with recurrent tonsillitis without any obvious complications tonight.  Do not feel that patient needs reimage with CT scan.  Will attempt to control symptoms as best as possible prior to discharged home.  She will need ENT follow-up as outpatient.  Final Clinical Impression(s) / ED Diagnoses Final diagnoses:  Tonsillitis    Rx / DC Orders ED Discharge Orders    None       Renne Crigler, Cordelia Poche 05/22/19 2149    Tegeler, Canary Brim, MD 05/22/19 [phone]

## 2019-05-22 NOTE — ED Provider Notes (Signed)
  Results for orders placed or performed during the hospital encounter of 05/22/19  Group A Strep by PCR   Specimen: Throat; Sterile Swab  Result Value Ref Range   Group A Strep by PCR NOT DETECTED NOT DETECTED  CBC  Result Value Ref Range   WBC 5.8 4.0 - 10.5 K/uL   RBC 4.30 3.87 - 5.11 MIL/uL   Hemoglobin 12.0 12.0 - 15.0 g/dL   HCT 75.6 43.3 - 29.5 %   MCV 84.2 80.0 - 100.0 fL   MCH 27.9 26.0 - 34.0 pg   MCHC 33.1 30.0 - 36.0 g/dL   RDW 18.8 41.6 - 60.6 %   Platelets 308 150 - 400 K/uL   nRBC 0.0 0.0 - 0.2 %  Basic metabolic panel  Result Value Ref Range   Sodium 139 135 - 145 mmol/L   Potassium 3.7 3.5 - 5.1 mmol/L   Chloride 109 98 - 111 mmol/L   CO2 24 22 - 32 mmol/L   Glucose, Bld 117 (H) 70 - 99 mg/dL   BUN 7 6 - 20 mg/dL   Creatinine, Ser 3.01 0.44 - 1.00 mg/dL   Calcium 8.7 (L) 8.9 - 10.3 mg/dL   GFR calc non Af Amer >60 >60 mL/min   GFR calc Af Amer >60 >60 mL/min   Anion gap 6 5 - 15    Labs reassuring.  Patient tolerating oral fluids here.  VSS.  Will d/c home with scripts and follow-up instructions as per PA Minerva Ends, PA-C 05/22/19 2316    Tegeler, Canary Brim, MD 05/22/19 [phone]

## 2019-05-22 NOTE — ED Triage Notes (Signed)
Pt states she has some sore throat that she was seen before and she had an abscess. C/o 10/10 pain, denies fever or chills.

## 2019-05-22 NOTE — Discharge Instructions (Signed)
Follow-up with Dr. Pollyann Kennedy.  Take antibiotics and pain medication as prescribed.  Return if you cannot swallow or develop severe pain.

## 2019-06-11 NOTE — H&P (Signed)
  HPI:   Carolyn Jenkins is a 19 y.o. female who presents as a return Patient.   Current problem: Tonsils.  HPI: Since I saw her in the hospital, she was discharged and was feeling back to normal within a few days. She then started having sore throat again. She was started back on antibiotics.  PMH/Meds/All/SocHx/FamHx/ROS:   History reviewed. No pertinent past medical history.  Past Surgical History:  Procedure Laterality Date  . NO PAST SURGERIES   No family history of bleeding disorders, wound healing problems or difficulty with anesthesia.   Social History   Socioeconomic History  . Marital status: Single  Spouse name: Not on file  . Number of children: Not on file  . Years of education: Not on file  . Highest education level: Not on file  Occupational History  . Not on file  Social Needs  . Financial resource strain: Not on file  . Food insecurity  Worry: Not on file  Inability: Not on file  . Transportation needs  Medical: Not on file  Non-medical: Not on file  Tobacco Use  . Smoking status: Current Every Day Smoker  Types: Cigars  . Smokeless tobacco: Never Used  Substance and Sexual Activity  . Alcohol use: Not Currently  . Drug use: Not Currently  . Sexual activity: Not on file  Lifestyle  . Physical activity  Days per week: Not on file  Minutes per session: Not on file  . Stress: Not on file  Relationships  . Social Multimedia programmer on phone: Not on file  Gets together: Not on file  Attends religious service: Not on file  Active member of club or organization: Not on file  Attends meetings of clubs or organizations: Not on file  Relationship status: Not on file  Other Topics Concern  . Not on file  Social History Narrative  . Not on file   Current Outpatient Medications:  . ibuprofen (ADVIL,MOTRIN) 200 MG tablet, Take 400 mg by mouth., Disp: , Rfl:    Physical Exam:   Obese young lady in no distress. Voice and breathing are clear and  healthy. There is no trismus. There is no palpable adenopathy. Oral cavity and pharynx reveals the tonsils are now symmetrically sized without any fullness or edema. There is punctate exudate on the surface on both sides. Tongue looks healthy. Pharynx otherwise clear.  Independent Review of Additional Tests or Records:  none  Procedures:  none  Impression & Plans:  Another episode of acute tonsillitis. No need for surgical intervention as an emergent issue. Given the recent history I think she would benefit with tonsillectomy.Herman meets the indications for tonsillectomy. Risks and benefits were discussed in detail. All questions were answered. A handout was provided with additional details.

## 2019-06-16 ENCOUNTER — Other Ambulatory Visit: Payer: Self-pay

## 2019-06-16 ENCOUNTER — Encounter (HOSPITAL_BASED_OUTPATIENT_CLINIC_OR_DEPARTMENT_OTHER): Payer: Self-pay | Admitting: Otolaryngology

## 2019-06-18 ENCOUNTER — Other Ambulatory Visit (HOSPITAL_COMMUNITY)
Admission: RE | Admit: 2019-06-18 | Discharge: 2019-06-18 | Disposition: A | Discharge status: Home / Self Care | Payer: BC Managed Care – PPO | Source: Ambulatory Visit | Attending: Otolaryngology | Admitting: Otolaryngology

## 2019-06-18 DIAGNOSIS — Z01812 Encounter for preprocedural laboratory examination: Secondary | ICD-10-CM | POA: Diagnosis present

## 2019-06-18 DIAGNOSIS — Z20822 Contact with and (suspected) exposure to covid-19: Secondary | ICD-10-CM | POA: Insufficient documentation

## 2019-06-18 LAB — SARS CORONAVIRUS 2 (TAT 6-24 HRS): SARS Coronavirus 2: NEGATIVE

## 2019-06-22 ENCOUNTER — Encounter (HOSPITAL_BASED_OUTPATIENT_CLINIC_OR_DEPARTMENT_OTHER): Payer: Self-pay | Admitting: Otolaryngology

## 2019-06-22 ENCOUNTER — Other Ambulatory Visit: Payer: Self-pay

## 2019-06-22 ENCOUNTER — Encounter (HOSPITAL_BASED_OUTPATIENT_CLINIC_OR_DEPARTMENT_OTHER)
Admission: RE | Disposition: A | Discharge status: Home / Self Care | Payer: Self-pay | Source: Ambulatory Visit | Attending: Otolaryngology

## 2019-06-22 ENCOUNTER — Ambulatory Visit (HOSPITAL_BASED_OUTPATIENT_CLINIC_OR_DEPARTMENT_OTHER): Payer: BC Managed Care – PPO | Admitting: Anesthesiology

## 2019-06-22 ENCOUNTER — Ambulatory Visit (HOSPITAL_BASED_OUTPATIENT_CLINIC_OR_DEPARTMENT_OTHER)
Admission: RE | Admit: 2019-06-22 | Discharge: 2019-06-22 | Disposition: A | Discharge status: Home / Self Care | Payer: BC Managed Care – PPO | Source: Ambulatory Visit | Attending: Otolaryngology | Admitting: Otolaryngology

## 2019-06-22 DIAGNOSIS — F17290 Nicotine dependence, other tobacco product, uncomplicated: Secondary | ICD-10-CM | POA: Insufficient documentation

## 2019-06-22 DIAGNOSIS — J0391 Acute recurrent tonsillitis, unspecified: Secondary | ICD-10-CM | POA: Diagnosis present

## 2019-06-22 DIAGNOSIS — Z68.41 Body mass index (BMI) pediatric, greater than or equal to 95th percentile for age: Secondary | ICD-10-CM | POA: Insufficient documentation

## 2019-06-22 DIAGNOSIS — Z9089 Acquired absence of other organs: Secondary | ICD-10-CM

## 2019-06-22 HISTORY — PX: TONSILLECTOMY: SHX5217

## 2019-06-22 HISTORY — DX: Other specified health status: Z78.9

## 2019-06-22 HISTORY — DX: Acute recurrent tonsillitis, unspecified: J03.91

## 2019-06-22 LAB — POCT PREGNANCY, URINE: Preg Test, Ur: NEGATIVE

## 2019-06-22 SURGERY — TONSILLECTOMY
Anesthesia: General | Site: Throat | Laterality: Bilateral

## 2019-06-22 MED ORDER — HYDROMORPHONE HCL 1 MG/ML IJ SOLN
INTRAMUSCULAR | Status: AC
  Filled 2019-06-22: qty 0.5

## 2019-06-22 MED ORDER — ROCURONIUM 10MG/ML (10ML) SYRINGE FOR MEDFUSION PUMP - OPTIME
INTRAVENOUS | Status: DC | PRN
  Administered 2019-06-22: 20 mg via INTRAVENOUS

## 2019-06-22 MED ORDER — PROMETHAZINE HCL 25 MG RE SUPP: 25.0000 mg | Freq: Four times a day (QID) | RECTAL | Status: DC | PRN

## 2019-06-22 MED ORDER — PHENOL 1.4 % MT LIQD
1.0000 | OROMUCOSAL | Status: DC | PRN
  Administered 2019-06-22: 1 via OROMUCOSAL
  Filled 2019-06-22: qty 177

## 2019-06-22 MED ORDER — DEXAMETHASONE SODIUM PHOSPHATE 10 MG/ML IJ SOLN
INTRAMUSCULAR | Status: AC
  Filled 2019-06-22: qty 1

## 2019-06-22 MED ORDER — DEXTROSE-NACL 5-0.9 % IV SOLN: INTRAVENOUS | Status: DC

## 2019-06-22 MED ORDER — OXYCODONE HCL 5 MG PO TABS: 5.0000 mg | ORAL_TABLET | Freq: Once | ORAL | Status: DC | PRN

## 2019-06-22 MED ORDER — PROMETHAZINE HCL 25 MG RE SUPP: 25.0000 mg | Freq: Four times a day (QID) | RECTAL | 1 refills | Status: DC | PRN

## 2019-06-22 MED ORDER — MEPERIDINE HCL 25 MG/ML IJ SOLN: 6.2500 mg | INTRAMUSCULAR | Status: DC | PRN

## 2019-06-22 MED ORDER — OXYCODONE HCL 5 MG/5ML PO SOLN: 5.0000 mg | Freq: Once | ORAL | Status: DC | PRN

## 2019-06-22 MED ORDER — MIDAZOLAM HCL 2 MG/2ML IJ SOLN
INTRAMUSCULAR | Status: AC
  Filled 2019-06-22: qty 2

## 2019-06-22 MED ORDER — HYDROCODONE-ACETAMINOPHEN 7.5-325 MG/15ML PO SOLN
10.0000 mL | ORAL | Status: DC | PRN
  Administered 2019-06-22: 14:00:00 15 mL via ORAL
  Filled 2019-06-22: qty 15

## 2019-06-22 MED ORDER — FENTANYL CITRATE (PF) 100 MCG/2ML IJ SOLN
INTRAMUSCULAR | Status: AC
  Filled 2019-06-22: qty 2

## 2019-06-22 MED ORDER — LIDOCAINE HCL (CARDIAC) PF 100 MG/5ML IV SOSY
PREFILLED_SYRINGE | INTRAVENOUS | Status: DC | PRN
  Administered 2019-06-22: 60 mg via INTRAVENOUS

## 2019-06-22 MED ORDER — SUCCINYLCHOLINE CHLORIDE 20 MG/ML IJ SOLN
INTRAMUSCULAR | Status: DC | PRN
  Administered 2019-06-22: 140 mg via INTRAVENOUS

## 2019-06-22 MED ORDER — PROMETHAZINE HCL 25 MG PO TABS: 25.0000 mg | ORAL_TABLET | Freq: Four times a day (QID) | ORAL | Status: DC | PRN

## 2019-06-22 MED ORDER — LACTATED RINGERS IV SOLN: INTRAVENOUS | Status: DC

## 2019-06-22 MED ORDER — DIPHENHYDRAMINE HCL 50 MG/ML IJ SOLN
INTRAMUSCULAR | Status: AC
  Filled 2019-06-22: qty 1

## 2019-06-22 MED ORDER — SUGAMMADEX SODIUM 200 MG/2ML IV SOLN
INTRAVENOUS | Status: DC | PRN
  Administered 2019-06-22: 200 mg via INTRAVENOUS

## 2019-06-22 MED ORDER — HYDROCODONE-ACETAMINOPHEN 7.5-325 MG/15ML PO SOLN: 15.0000 mL | Freq: Four times a day (QID) | ORAL | 0 refills | Status: DC | PRN

## 2019-06-22 MED ORDER — IBUPROFEN 100 MG/5ML PO SUSP
400.0000 mg | Freq: Four times a day (QID) | ORAL | Status: DC | PRN
  Administered 2019-06-22: 15:00:00 400 mg via ORAL

## 2019-06-22 MED ORDER — DEXAMETHASONE SODIUM PHOSPHATE 4 MG/ML IJ SOLN
INTRAMUSCULAR | Status: DC | PRN
  Administered 2019-06-22: 5 mg via INTRAVENOUS

## 2019-06-22 MED ORDER — IBUPROFEN 100 MG/5ML PO SUSP
ORAL | Status: AC
  Filled 2019-06-22: qty 30

## 2019-06-22 MED ORDER — PROMETHAZINE HCL 25 MG/ML IJ SOLN: 6.2500 mg | INTRAMUSCULAR | Status: DC | PRN

## 2019-06-22 MED ORDER — LIDOCAINE 2% (20 MG/ML) 5 ML SYRINGE
INTRAMUSCULAR | Status: AC
  Filled 2019-06-22: qty 5

## 2019-06-22 MED ORDER — MIDAZOLAM HCL 5 MG/5ML IJ SOLN
INTRAMUSCULAR | Status: DC | PRN
  Administered 2019-06-22: 2 mg via INTRAVENOUS

## 2019-06-22 MED ORDER — FENTANYL CITRATE (PF) 100 MCG/2ML IJ SOLN
INTRAMUSCULAR | Status: DC | PRN
  Administered 2019-06-22: 50 ug via INTRAVENOUS
  Administered 2019-06-22: 100 ug via INTRAVENOUS
  Administered 2019-06-22: 50 ug via INTRAVENOUS

## 2019-06-22 MED ORDER — ONDANSETRON HCL 4 MG/2ML IJ SOLN
INTRAMUSCULAR | Status: AC
  Filled 2019-06-22: qty 2

## 2019-06-22 MED ORDER — PROPOFOL 10 MG/ML IV BOLUS
INTRAVENOUS | Status: AC
  Filled 2019-06-22: qty 20

## 2019-06-22 MED ORDER — DIPHENHYDRAMINE HCL 50 MG/ML IJ SOLN
6.2500 mg | Freq: Once | INTRAMUSCULAR | Status: AC
  Administered 2019-06-22: 6.25 mg via INTRAVENOUS

## 2019-06-22 MED ORDER — HYDROMORPHONE HCL 1 MG/ML IJ SOLN
0.2500 mg | INTRAMUSCULAR | Status: DC | PRN
  Administered 2019-06-22 (×3): 0.5 mg via INTRAVENOUS

## 2019-06-22 MED ORDER — PROPOFOL 500 MG/50ML IV EMUL
INTRAVENOUS | Status: DC | PRN
  Administered 2019-06-22: 50 ug/kg/min via INTRAVENOUS

## 2019-06-22 SURGICAL SUPPLY — 32 items
CANISTER SUCT 1200ML W/VALVE (MISCELLANEOUS) ×3 IMPLANT
CATH ROBINSON RED A/P 12FR (CATHETERS) ×3 IMPLANT
COAGULATOR SUCT 6 FR SWTCH (ELECTROSURGICAL) ×1
COAGULATOR SUCT SWTCH 10FR 6 (ELECTROSURGICAL) ×2 IMPLANT
COVER BACK TABLE 60X90IN (DRAPES) ×3 IMPLANT
COVER MAYO STAND STRL (DRAPES) ×3 IMPLANT
COVER WAND RF STERILE (DRAPES) IMPLANT
ELECT COATED BLADE 2.86 ST (ELECTRODE) ×3 IMPLANT
ELECT REM PT RETURN 9FT ADLT (ELECTROSURGICAL) ×3
ELECT REM PT RETURN 9FT PED (ELECTROSURGICAL)
ELECTRODE REM PT RETRN 9FT PED (ELECTROSURGICAL) IMPLANT
ELECTRODE REM PT RTRN 9FT ADLT (ELECTROSURGICAL) IMPLANT
GAUZE SPONGE 4X4 12PLY STRL LF (GAUZE/BANDAGES/DRESSINGS) ×3 IMPLANT
GLOVE BIOGEL PI IND STRL 7.0 (GLOVE) IMPLANT
GLOVE BIOGEL PI INDICATOR 7.0 (GLOVE) ×2
GLOVE ECLIPSE 6.5 STRL STRAW (GLOVE) ×3 IMPLANT
GLOVE ECLIPSE 7.5 STRL STRAW (GLOVE) ×3 IMPLANT
GOWN STRL REUS W/ TWL LRG LVL3 (GOWN DISPOSABLE) ×2 IMPLANT
GOWN STRL REUS W/TWL LRG LVL3 (GOWN DISPOSABLE) ×6
MARKER SKIN DUAL TIP RULER LAB (MISCELLANEOUS) IMPLANT
NS IRRIG 1000ML POUR BTL (IV SOLUTION) ×3 IMPLANT
PENCIL FOOT CONTROL (ELECTRODE) ×3 IMPLANT
SHEET MEDIUM DRAPE 40X70 STRL (DRAPES) ×3 IMPLANT
SOLUTION BUTLER CLEAR DIP (MISCELLANEOUS) ×3 IMPLANT
SPONGE TONSIL TAPE 1 RFD (DISPOSABLE) IMPLANT
SPONGE TONSIL TAPE 1.25 RFD (DISPOSABLE) ×2 IMPLANT
SYR BULB 3OZ (MISCELLANEOUS) ×3 IMPLANT
TOWEL GREEN STERILE FF (TOWEL DISPOSABLE) ×3 IMPLANT
TUBE CONNECTING 20'X1/4 (TUBING) ×1
TUBE CONNECTING 20X1/4 (TUBING) ×2 IMPLANT
TUBE SALEM SUMP 12R W/ARV (TUBING) IMPLANT
TUBE SALEM SUMP 16 FR W/ARV (TUBING) ×2 IMPLANT

## 2019-06-22 NOTE — Op Note (Signed)
06/22/2019  10:42 AM  PATIENT:  Karis Juba  19 y.o. female  PRE-OPERATIVE DIAGNOSIS:  recurrent tonsillitis  POST-OPERATIVE DIAGNOSIS:  recurrent tonsillitis  PROCEDURE:  Procedure(s): TONSILLECTOMY  SURGEON:  Surgeon(s): [name], MD  ANESTHESIA:   General  COUNTS: Correct   DICTATION: The patient was taken to the operating room and placed on the operating table in the supine position. Following induction of general endotracheal anesthesia, the table was turned and the patient was draped in a standard fashion. A Crowe-Davis mouthgag was inserted into the oral cavity and used to retract the tongue and mandible, then attached to the Mayo stand.  The tonsillectomy was then performed using electrocautery dissection, carefully dissecting the avascular plane between the capsule and constrictor muscles. Cautery was used for completion of hemostasis. The tonsils were severely enlarged and cryptic with surrounding fibrosis , and were discarded.  The pharynx was irrigated with saline and suctioned. An oral gastric tube was used to aspirate the contents of the stomach. The patient was then awakened from anesthesia and transferred to PACU in stable condition.   PATIENT DISPOSITION:  To PACA, stable

## 2019-06-22 NOTE — Anesthesia Preprocedure Evaluation (Signed)
Anesthesia Evaluation  Patient identified by MRN, date of birth, ID band Patient awake    Reviewed: Allergy & Precautions, NPO status , Patient's Chart, lab work & pertinent test results  Airway Mallampati: II  TM Distance: >3 FB Neck ROM: Full    Dental no notable dental hx.    Pulmonary neg pulmonary ROS, Current Smoker and Patient abstained from smoking.,    Pulmonary exam normal breath sounds clear to auscultation       Cardiovascular negative cardio ROS Normal cardiovascular exam Rhythm:Regular Rate:Normal     Neuro/Psych negative neurological ROS  negative psych ROS   GI/Hepatic negative GI ROS, Neg liver ROS,   Endo/Other  Morbid obesity  Renal/GU negative Renal ROS  negative genitourinary   Musculoskeletal negative musculoskeletal ROS (+)   Abdominal (+) + obese,   Peds negative pediatric ROS (+)  Hematology negative hematology ROS (+)   Anesthesia Other Findings   Reproductive/Obstetrics negative OB ROS                             Anesthesia Physical Anesthesia Plan  ASA: III  Anesthesia Plan: General   Post-op Pain Management:    Induction: Intravenous  PONV Risk Score and Plan: 2 and Ondansetron, Midazolam and Treatment may vary due to age or medical condition  Airway Management Planned: Oral ETT  Additional Equipment:   Intra-op Plan:   Post-operative Plan: Extubation in OR  Informed Consent: I have reviewed the patients History and Physical, chart, labs and discussed the procedure including the risks, benefits and alternatives for the proposed anesthesia with the patient or authorized representative who has indicated his/her understanding and acceptance.     Dental advisory given  Plan Discussed with: CRNA  Anesthesia Plan Comments:         Anesthesia Quick Evaluation

## 2019-06-22 NOTE — Anesthesia Procedure Notes (Signed)
Procedure Name: Intubation Date/Time: 06/22/2019 11:16 AM Performed by: Maryella Shivers, CRNA Pre-anesthesia Checklist: Patient identified, Emergency Drugs available, Suction available and Patient being monitored Patient Re-evaluated:Patient Re-evaluated prior to induction Oxygen Delivery Method: Circle system utilized Preoxygenation: Pre-oxygenation with 100% oxygen Induction Type: IV induction Ventilation: Mask ventilation without difficulty Laryngoscope Size: Mac and 4 Grade View: Grade I Tube type: Oral Tube size: 7.0 mm Number of attempts: 1 Airway Equipment and Method: Stylet and Oral airway Placement Confirmation: ETT inserted through vocal cords under direct vision,  positive ETCO2 and breath sounds checked- equal and bilateral Secured at: 21 cm Tube secured with: Tape Dental Injury: Teeth and Oropharynx as per pre-operative assessment

## 2019-06-22 NOTE — Discharge Instructions (Signed)
Tonsillectomy, Adult, Care After This sheet gives you information about how to care for yourself after your procedure. Your doctor may also give you more specific instructions. If you have problems or questions, contact your doctor. Follow these instructions at home: Eating and drinking   Follow instructions from your doctor about eating and drinking.  For many days after surgery, choose foods that are soft and cold. Examples are: ? Gelatin. ? Sherbet. ? Ice cream. ? Frozen ice pops.  If you have an upset stomach (are nauseous), choose liquids that are cold and that you can see through (clear liquids). Examples are water and apple juice without pulp. You can try thick liquids and soft foods when you can eat without throwing up (vomiting) and without too much pain. Examples are: ? Creamed soups. ? Soft, warm cereals, such as oatmeal or hot wheat cereal. ? Milk. ? Mashed potatoes. ? Applesauce.  Drink enough fluid to keep your pee (urine) clear or pale yellow. Driving  Do not drive for 24 hours if you were given a medicine to help you relax (sedative).  Do not drive or use heavy machinery while taking prescription pain medicine or until your health care provider approves. General instructions  Rest.  Keep your head raised (elevated) when lying down.  Take medicines only as told by your doctor. These include over-the-counter medicines and prescription medicines.  Do not use mouthwashes until your doctor says it is okay.  Gargle only as told by your doctor.  Stay away from people who are sick. Contact a doctor if:  Your pain gets worse or medicines do not help.  You have a fever.  You have a rash.  You feel light-headed or you pass out (faint).  You cannot swallow even a little liquid or spit (saliva).  Your pee is very dark. Get help right away if:  You have trouble breathing.  You bleed bright red blood from your throat.  You throw up bright red  blood. Summary  Follow instructions from your doctor about what you cannot eat or drink. For many days after surgery, choose foods that are soft and cold.  Talk with your doctor about how to keep your pain under control. This can help you rest and swallow better.  Get help right away if you bleed bright red blood from your throat or you throw up bright red blood. This information is not intended to replace advice given to you by your health care provider. Make sure you discuss any questions you have with your health care provider. Document Revised: 03/22/2017 Document Reviewed: 03/02/2016 Elsevier Patient Education  [place] Instructions  Activity: Get plenty of rest for the remainder of the day. A responsible individual must stay with you for 24 hours following the procedure.  For the next 24 hours, DO NOT: -Drive a car -Paediatric nurse -Drink alcoholic beverages -Take any medication unless instructed by your physician -Make any legal decisions or sign important papers.  Meals: Start with liquid foods such as gelatin or soup. Progress to regular foods as tolerated. Avoid greasy, spicy, heavy foods. If nausea and/or vomiting occur, drink only clear liquids until the nausea and/or vomiting subsides. Call your physician if vomiting continues.  Special Instructions/Symptoms: Your throat may feel dry or sore from the anesthesia or the breathing tube placed in your throat during surgery. If this causes discomfort, gargle with warm salt water. The discomfort should disappear within 24 hours.  If you had  a scopolamine patch placed behind your ear for the management of post- operative nausea and/or vomiting:  1. The medication in the patch is effective for 72 hours, after which it should be removed.  Wrap patch in a tissue and discard in the trash. Wash hands thoroughly with soap and water. 2. You may remove the patch earlier than 72 hours if you  experience unpleasant side effects which may include dry mouth, dizziness or visual disturbances. 3. Avoid touching the patch. Wash your hands with soap and water after contact with the patch.

## 2019-06-22 NOTE — Anesthesia Postprocedure Evaluation (Signed)
Anesthesia Post Note  Patient: Carolyn Jenkins  Procedure(s) Performed: TONSILLECTOMY (Bilateral Throat)     Patient location during evaluation: PACU Anesthesia Type: General Level of consciousness: awake and alert Pain management: pain level controlled Vital Signs Assessment: post-procedure vital signs reviewed and stable Respiratory status: spontaneous breathing, nonlabored ventilation and respiratory function stable Cardiovascular status: blood pressure returned to baseline and stable Postop Assessment: no apparent nausea or vomiting Anesthetic complications: no    Last Vitals:  Vitals:   06/22/19 1200 06/22/19 1215  BP: 117/74 125/84  Pulse: 99 97  Resp: 19 (!) 22  Temp:    SpO2: 96% 96%    Last Pain:  Vitals:   06/22/19 1215  TempSrc:   PainSc: Asleep                 Lowella Curb

## 2019-06-22 NOTE — Interval H&P Note (Signed)
History and Physical Interval Note:  06/22/2019 9:59 AM  Carolyn Jenkins  has presented today for surgery, with the diagnosis of recurrent tonsillitis.  The various methods of treatment have been discussed with the patient and family. After consideration of risks, benefits and other options for treatment, the patient has consented to  Procedure(s): TONSILLECTOMY (Bilateral) as a surgical intervention.  The patient's history has been reviewed, patient examined, no change in status, stable for surgery.  I have reviewed the patient's chart and labs.  Questions were answered to the patient's satisfaction.     [name]

## 2019-06-22 NOTE — Transfer of Care (Signed)
Immediate Anesthesia Transfer of Care Note  Patient: Carolyn Jenkins  Procedure(s) Performed: TONSILLECTOMY (Bilateral Throat)  Patient Location: PACU  Anesthesia Type:General  Level of Consciousness: sedated  Airway & Oxygen Therapy: Patient Spontanous Breathing and Patient connected to face mask oxygen  Post-op Assessment: Report given to RN and Post -op Vital signs reviewed and stable  Post vital signs: Reviewed and stable  Last Vitals:  Vitals Value Taken Time  BP 135/80 06/22/19 1056  Temp    Pulse 108 06/22/19 1059  Resp 16 06/22/19 1059  SpO2 100 % 06/22/19 1059  Vitals shown include unvalidated device data.  Last Pain:  Vitals:   06/22/19 0926  TempSrc: Tympanic  PainSc: 0-No pain         Complications: No apparent anesthesia complications

## 2019-07-04 ENCOUNTER — Emergency Department (HOSPITAL_COMMUNITY): Payer: BC Managed Care – PPO

## 2019-07-04 ENCOUNTER — Other Ambulatory Visit: Payer: Self-pay

## 2019-07-04 ENCOUNTER — Encounter (HOSPITAL_COMMUNITY): Payer: Self-pay | Admitting: Emergency Medicine

## 2019-07-04 ENCOUNTER — Emergency Department (HOSPITAL_COMMUNITY)
Admission: EM | Admit: 2019-07-04 | Discharge: 2019-07-04 | Disposition: A | Discharge status: Home / Self Care | Payer: BC Managed Care – PPO | Attending: Emergency Medicine | Admitting: Emergency Medicine

## 2019-07-04 DIAGNOSIS — R0789 Other chest pain: Secondary | ICD-10-CM | POA: Diagnosis not present

## 2019-07-04 DIAGNOSIS — R112 Nausea with vomiting, unspecified: Secondary | ICD-10-CM | POA: Diagnosis not present

## 2019-07-04 DIAGNOSIS — Z9889 Other specified postprocedural states: Secondary | ICD-10-CM | POA: Diagnosis not present

## 2019-07-04 DIAGNOSIS — F17290 Nicotine dependence, other tobacco product, uncomplicated: Secondary | ICD-10-CM | POA: Insufficient documentation

## 2019-07-04 DIAGNOSIS — E860 Dehydration: Secondary | ICD-10-CM | POA: Diagnosis not present

## 2019-07-04 DIAGNOSIS — J029 Acute pharyngitis, unspecified: Secondary | ICD-10-CM | POA: Insufficient documentation

## 2019-07-04 LAB — CBC
HCT: 39.6 % (ref 36.0–46.0)
Hemoglobin: 13.1 g/dL (ref 12.0–15.0)
MCH: 27.3 pg (ref 26.0–34.0)
MCHC: 33.1 g/dL (ref 30.0–36.0)
MCV: 82.7 fL (ref 80.0–100.0)
Platelets: 370 10*3/uL (ref 150–400)
RBC: 4.79 MIL/uL (ref 3.87–5.11)
RDW: 14 % (ref 11.5–15.5)
WBC: 6.5 10*3/uL (ref 4.0–10.5)
nRBC: 0 % (ref 0.0–0.2)

## 2019-07-04 LAB — TROPONIN I (HIGH SENSITIVITY)
Troponin I (High Sensitivity): 4 ng/L (ref <18)
Troponin I (High Sensitivity): 10 ng/L (ref <18)

## 2019-07-04 LAB — BASIC METABOLIC PANEL
Anion gap: 13 (ref 5–15)
BUN: 6 mg/dL (ref 6–20)
CO2: 21 mmol/L — ABNORMAL LOW (ref 22–32)
Calcium: 9.7 mg/dL (ref 8.9–10.3)
Chloride: 103 mmol/L (ref 98–111)
Creatinine, Ser: 0.8 mg/dL (ref 0.44–1.00)
GFR calc Af Amer: >60 mL/min (ref >60)
GFR calc non Af Amer: >60 mL/min (ref >60)
Glucose, Bld: 131 mg/dL — ABNORMAL HIGH (ref 70–99)
Potassium: 3.3 mmol/L — ABNORMAL LOW (ref 3.5–5.1)
Sodium: 137 mmol/L (ref 135–145)

## 2019-07-04 LAB — HEPATIC FUNCTION PANEL
ALT: 13 U/L (ref 0–44)
AST: 18 U/L (ref 15–41)
Albumin: 4.2 g/dL (ref 3.5–5.0)
Alkaline Phosphatase: 69 U/L (ref 38–126)
Bilirubin, Direct: <0.1 mg/dL (ref 0.0–0.2)
Total Bilirubin: 0.8 mg/dL (ref 0.3–1.2)
Total Protein: 8.8 g/dL — ABNORMAL HIGH (ref 6.5–8.1)

## 2019-07-04 LAB — D-DIMER, QUANTITATIVE: D-Dimer, Quant: 0.73 ug/mL-FEU — ABNORMAL HIGH (ref 0.00–0.50)

## 2019-07-04 LAB — I-STAT BETA HCG BLOOD, ED (MC, WL, AP ONLY): I-stat hCG, quantitative: <5 m[IU]/mL (ref <5)

## 2019-07-04 LAB — LIPASE, BLOOD: Lipase: 27 U/L (ref 11–51)

## 2019-07-04 LAB — LACTIC ACID, PLASMA: Lactic Acid, Venous: 1.3 mmol/L (ref 0.5–1.9)

## 2019-07-04 MED ORDER — ONDANSETRON 4 MG PO TBDP: 4.0000 mg | ORAL_TABLET | Freq: Three times a day (TID) | ORAL | 0 refills | Status: DC | PRN

## 2019-07-04 MED ORDER — IOHEXOL 350 MG/ML SOLN
100.0000 mL | Freq: Once | INTRAVENOUS | Status: AC | PRN
  Administered 2019-07-04: 100 mL via INTRAVENOUS

## 2019-07-04 MED ORDER — LACTATED RINGERS IV BOLUS
2000.0000 mL | Freq: Once | INTRAVENOUS | Status: AC
  Administered 2019-07-04: 18:00:00 2000 mL via INTRAVENOUS

## 2019-07-04 MED ORDER — SODIUM CHLORIDE 0.9 % IV BOLUS
1000.0000 mL | Freq: Once | INTRAVENOUS | Status: AC
  Administered 2019-07-04: 1000 mL via INTRAVENOUS

## 2019-07-04 MED ORDER — SODIUM CHLORIDE 0.9% FLUSH: 3.0000 mL | Freq: Once | INTRAVENOUS | Status: DC

## 2019-07-04 MED ORDER — LACTATED RINGERS IV BOLUS: 1000.0000 mL | Freq: Once | INTRAVENOUS | Status: DC

## 2019-07-04 MED ORDER — DEXAMETHASONE SODIUM PHOSPHATE 10 MG/ML IJ SOLN
10.0000 mg | Freq: Once | INTRAMUSCULAR | Status: AC
  Administered 2019-07-04: 20:00:00 10 mg via INTRAVENOUS
  Filled 2019-07-04: qty 1

## 2019-07-04 MED ORDER — ONDANSETRON HCL 4 MG/2ML IJ SOLN
4.0000 mg | Freq: Once | INTRAMUSCULAR | Status: AC
  Administered 2019-07-04: 4 mg via INTRAVENOUS
  Filled 2019-07-04: qty 2

## 2019-07-04 NOTE — Discharge Instructions (Signed)
Please make sure you are drinking plenty of fluids.  Here you did not have a fever.  Please continue to monitor yourself at home.  If you develop fevers despite not eating or drinking anything for 15 minutes before hand please seek additional medical care and evaluation. If you develop shortness of breath, are unable to swallow your own secretions or you have any other concerns please seek additional medical care and evaluation.

## 2019-07-04 NOTE — ED Triage Notes (Signed)
Pt had tonsillectomy on 3/1.  Reports burning to upper chest and vomiting clear phlegm after eating since last Friday.  States symptoms have gotten worse today.  Pt tachycardic.

## 2019-07-04 NOTE — ED Notes (Signed)
Assumed care of pt. Pt alert, speaking in full sentences. Breathing easy, non-labored. Pt ambulatory to and from BR with strong, steady gait. Warm blankets provided

## 2019-07-04 NOTE — ED Notes (Signed)
PA at bedside.

## 2019-07-04 NOTE — ED Notes (Signed)
Patient verbalizes understanding of discharge instructions, prescription medications, and follow up care. Opportunity for questioning and answers were provided. All questions answered completely. Armband removed by staff, pt discharged from ED with all belongings. Ambulatory from ED with strong, steady gait

## 2019-07-04 NOTE — ED Notes (Signed)
Called lab to add on labs and they asked for reqs to be sent

## 2019-07-04 NOTE — ED Provider Notes (Signed)
[facility] EMERGENCY DEPARTMENT Provider Note   CSN: [id number] Arrival date & time: 07/04/19  1609     History Chief Complaint  Patient presents with  . Chest Pain  . Emesis    Carolyn Jenkins is a 19 y.o. female with a past medical history of peritonsillar abscess, status post tonsillectomy by Dr. Pollyann Kennedy on 06/22/2019, postop day 12 who presents today for evaluation of multiple complaints. She reports that on the eighth day postop she started getting worse.  She reports fevers up to 102 at home.  She states that she has not had any antipyretics today.  She reports a burning feeling started today in her upper chest.  She reports that any time she attempts to eat or drink she feels like food gets hung up in her throat.  She denies any drooling.  She finished her pain medications on postop day 7. She denies any significant shortness of breath.  She did take Tums today without improvement in her symptoms.  She reports that she had diarrhea after her surgery, however that has resolved.  She did attempt to call Dr. Lucky Rathke office yesterday and was told that if her symptoms worsen she needed to present to the emergency room for evaluation.  HPI     Past Medical History:  Diagnosis Date  . Medical history non-contributory   . Recurrent tonsillitis     Patient Active Problem List   Diagnosis Date Noted  . S/P tonsillectomy 06/22/2019  . Peritonsillar abscess 05/10/2019  . Tonsillitis with exudate 05/10/2019    Past Surgical History:  Procedure Laterality Date  . NO PAST SURGERIES    . TONSILLECTOMY Bilateral 06/22/2019   Procedure: TONSILLECTOMY;  Surgeon: [name], MD;  Location: [facility];  Service: ENT;  Laterality: Bilateral;     OB History   No obstetric history on file.     No family history on file.  Social History   Tobacco Use  . Smoking status: Current Every Day Smoker    Types: Cigars  . Smokeless tobacco: Never Used  .  Tobacco comment: 5 per day, Black and Milds  Substance Use Topics  . Alcohol use: No  . Drug use: Never    Home Medications Prior to Admission medications   Medication Sig Start Date End Date Taking? Authorizing Provider  HYDROcodone-acetaminophen (HYCET) 7.5-325 mg/15 ml solution Take 15 mLs by mouth 4 (four) times daily as needed for moderate pain. Patient not taking: Reported on 07/04/2019 06/22/19   [name], MD  HYDROcodone-acetaminophen (HYCET) 7.5-325 mg/15 ml solution Take 15 mLs by mouth 4 (four) times daily as needed for moderate pain. Patient not taking: Reported on 07/04/2019 06/22/19   [name], MD  ondansetron (ZOFRAN ODT) 4 MG disintegrating tablet Take 1 tablet (4 mg total) by mouth every 8 (eight) hours as needed for nausea or vomiting. 07/04/19   Cristina Gong, PA-C  promethazine (PHENERGAN) 25 MG suppository Place 1 suppository (25 mg total) rectally every 6 (six) hours as needed for nausea or vomiting. Patient not taking: Reported on 07/04/2019 06/22/19   [name], MD    Allergies    Patient has no known allergies.  Review of Systems   Review of Systems  Constitutional: Positive for fever. Negative for chills.  HENT: Positive for sore throat and trouble swallowing. Negative for congestion.   Eyes: Negative for visual disturbance.  Respiratory: Positive for chest tightness and shortness of breath.   Cardiovascular: Positive for chest  pain.  Gastrointestinal: Positive for diarrhea (Resolved), nausea and vomiting. Negative for abdominal pain.  Musculoskeletal: Negative for back pain and neck pain.  Skin: Negative for color change, rash and wound.  Neurological: Negative for weakness and headaches.  All other systems reviewed and are negative.   Physical Exam Updated Vital Signs BP (!) 108/59   Pulse 98   Temp 98.2 F (36.8 C) (Rectal)   Resp 18   Ht 5\' 9"  (1.753 m)   Wt 117.5 kg   LMP 06/04/2019 (Exact Date)   SpO2 99%   BMI 38.26 kg/m    Physical Exam Vitals and nursing note reviewed.  Constitutional:      General: She is not in acute distress.    Appearance: She is well-developed. She is not diaphoretic.  HENT:     Head: Normocephalic and atraumatic.     Comments: S/p bilateral tonsillectomy.  No active bleeding from the wound bed.  Uvula is symmetrical.  She is tolerating secretions without difficulty and able to swallow.  Tonsillar wound beds are symmetric bilaterally. There is no significant edema submandibularly or elevation of the floor of the mouth. Eyes:     General: No scleral icterus.       Right eye: No discharge.        Left eye: No discharge.     Conjunctiva/sclera: Conjunctivae normal.  Cardiovascular:     Rate and Rhythm: Normal rate and regular rhythm.     Heart sounds: Normal heart sounds.  Pulmonary:     Effort: Pulmonary effort is normal. No respiratory distress.     Breath sounds: No stridor.  Chest:     Chest wall: Tenderness (Palpation over superior sternocostal joints both recreates and exacerbates her reported pain. ) present.  Abdominal:     General: There is no distension.  Musculoskeletal:        General: No deformity.     Cervical back: Normal range of motion.     Right lower leg: No tenderness. No edema.     Left lower leg: No tenderness. No edema.  Skin:    General: Skin is warm and dry.  Neurological:     Mental Status: She is alert.     Motor: No abnormal muscle tone.  Psychiatric:        Behavior: Behavior normal.     ED Results / Procedures / Treatments   Labs (all labs ordered are listed, but only abnormal results are displayed) Labs Reviewed  BASIC METABOLIC PANEL - Abnormal; Notable for the following components:      Result Value   Potassium 3.3 (*)    CO2 21 (*)    Glucose, Bld 131 (*)    All other components within normal limits  HEPATIC FUNCTION PANEL - Abnormal; Notable for the following components:   Total Protein 8.8 (*)    All other components within  normal limits  D-DIMER, QUANTITATIVE (NOT AT [facility]) - Abnormal; Notable for the following components:   D-Dimer, Quant 0.73 (*)    All other components within normal limits  CULTURE, BLOOD (ROUTINE X 2)  CULTURE, BLOOD (ROUTINE X 2)  CBC  LIPASE, BLOOD  LACTIC ACID, PLASMA  I-STAT BETA HCG BLOOD, ED ([facility], [facility], [facility] ONLY)  I-STAT BETA HCG BLOOD, ED ([facility], [facility], [facility] ONLY)  TROPONIN I (HIGH SENSITIVITY)  TROPONIN I (HIGH SENSITIVITY)    EKG EKG Interpretation  Date/Time:  Saturday July 04 2019 16:17:37 EST Ventricular Rate:  130 PR Interval:  128  QRS Duration: 70 QT Interval:  294 QTC Calculation: 432 R Axis:   63 Text Interpretation: Sinus tachycardia Otherwise normal ECG No previous tracing Confirmed by Gwyneth Sprout ([id number]) on 07/04/2019 5:09:00 PM   Radiology DG Chest 2 View  Result Date: 07/04/2019 CLINICAL DATA:  Chest pain for 2 weeks. EXAM: CHEST - 2 VIEW COMPARISON:  None. FINDINGS: Lungs clear. Heart size normal. No pneumothorax or pleural fluid. No bony abnormality. IMPRESSION: Normal chest. Electronically Signed   By: Drusilla Kanner M.D.   On: 07/04/2019 17:02   CT Angio Chest PE W/Cm &/Or Wo Cm  Result Date: 07/04/2019 CLINICAL DATA:  Shortness of breath PE suspected, low/intermediate prob, positive D-dimer EXAM: CT ANGIOGRAPHY CHEST WITH CONTRAST TECHNIQUE: Multidetector CT imaging of the chest was performed using the standard protocol during bolus administration of intravenous contrast. Multiplanar CT image reconstructions and MIPs were obtained to evaluate the vascular anatomy. CONTRAST:  OMNIPAQUE IOHEXOL 350 MG/ML SOLN COMPARISON:  Radiograph earlier this day. FINDINGS: Cardiovascular: There are no filling defects within the pulmonary arteries to suggest pulmonary embolus. No aortic dissection. Heart is normal in size. No pericardial effusion. Mediastinum/Nodes: Prominent right hilar node, likely reactive. No suspicious or enlarged mediastinal or hilar lymph nodes.  No axillary adenopathy. Decompressed esophagus. No visualized thyroid nodule. Lungs/Pleura: Clear lungs. No consolidation. No pleural fluid. No pulmonary edema. Upper Abdomen: Prominent liver partially included. No acute findings. Musculoskeletal: Normal. Review of the MIP images confirms the above findings. IMPRESSION: No pulmonary embolus or acute intrathoracic abnormality. Electronically Signed   By: Narda Rutherford M.D.   On: 07/04/2019 22:45    Procedures Procedures (including critical care time)  Medications Ordered in ED Medications  sodium chloride flush (NS) 0.9 % injection 3 mL (has no administration in time range)  lactated ringers bolus 2,000 mL (0 mLs Intravenous Stopped 07/04/19 2341)  dexamethasone (DECADRON) injection 10 mg (10 mg Intravenous Given 07/04/19 2018)  ondansetron (ZOFRAN) injection 4 mg (4 mg Intravenous Given 07/04/19 2018)  sodium chloride 0.9 % bolus 1,000 mL (0 mLs Intravenous Stopped 07/04/19 2341)  iohexol (OMNIPAQUE) 350 MG/ML injection 100 mL (100 mLs Intravenous Contrast Given 07/04/19 2228)    ED Course  I have reviewed the triage vital signs and the nursing notes.  Pertinent labs & imaging results that were available during my care of the patient were reviewed by me and considered in my medical decision making (see chart for details).  Clinical Course as of Jul 05 114  Sat Jul 04, 2019  1757 I spoke with on-call ENT Dr. Jearld Fenton who recommended continuing to give her IV fluids, giving her Zofran, and 10 of Decadron.  Approximately 1 hour after the Decadron will attempt a p.o. challenge.  If she does not pass p.o. challenge will call him back for admission.   [EH]  2052 Patient continues to remain tachycardic despite 3 L of fluid.  At this point concern for other causes of her tachycardia.  She did recently have general anesthesia with chest tightness and occasional shortness of breath.  D-dimer ordered.  Pulse Rate(!): 105 [EH]  2214 Patient re-evaluated,  has gotten 2 of her 3 liters.  Still tachycardic.  D-dimer is elevated. Will send for CTA PE study.    [EH]    Clinical Course User Index [EH] Norman Clay   MDM Rules/Calculators/A&P                     Patient is a 19 year old woman,  postop day 12 from tonsillectomy who presents today for evaluation of nausea and vomiting.  On arrival she is tachycardic.  Her white count is not significantly elevated and she is afebrile here.  She reports fever at home today, however denies any antipyretic use.  Her lactic acid is not elevated.  Blood cultures were sent given her tachycardia and reported fevers at home.  She does not have marked leukocytosis.  3 L of IV fluids were ordered, she received almost all 3 L while in the emergency room.  This is still under her 30 mL/kg fluid bolus however she is not hypotensive and her lactic is not over 4. I spoke with on-call ENT who recommended IV fluids, Zofran, Decadron and p.o. challenge.  Despite adequate rehydration patient was still tachycardic.  Given recent surgery along with upper chest pain in the tachycardia not resolving when dehydration is corrected concern for PE.  D-dimer was obtained and is elevated at 0.73.  CT a PE study was ordered without evidence of PE or cause for her symptoms found.  She does have some lymph nodes which I suspect are overall reactive given her recent postoperative state.  After Zofran she was able to p.o. challenge without difficulty, overall felt better. She is given a prescription for Zofran at home.  Recommended close outpatient follow-up.  She did not have any evidence of stridor and was able to handle her secretions without difficulty.  I suspect that her feeling of things getting hung up in her throat is primarily related to her postsurgical state.  Return precautions were discussed with patient who states their understanding.  At the time of discharge patient denied any unaddressed complaints or concerns.   Patient is agreeable for discharge home.  Note: Portions of this report may have been transcribed using voice recognition software. Every effort was made to ensure accuracy; however, inadvertent computerized transcription errors may be present   Final Clinical Impression(s) / ED Diagnoses Final diagnoses:  Non-intractable vomiting with nausea, unspecified vomiting type  Dehydration    Rx / DC Orders ED Discharge Orders         Ordered    ondansetron (ZOFRAN ODT) 4 MG disintegrating tablet  Every 8 hours PRN     07/04/19 2328           Cristina Gong, PA-C 07/05/19 0116    Gwyneth Sprout, MD 07/06/19 2111

## 2019-07-04 NOTE — ED Notes (Signed)
Pt transported to CT ?

## 2019-07-09 LAB — CULTURE, BLOOD (ROUTINE X 2)
Culture: NO GROWTH
Culture: NO GROWTH
Special Requests: ADEQUATE

## 2019-11-11 ENCOUNTER — Encounter (HOSPITAL_COMMUNITY): Payer: Self-pay

## 2019-11-11 ENCOUNTER — Other Ambulatory Visit: Payer: Self-pay

## 2019-11-11 ENCOUNTER — Emergency Department (HOSPITAL_COMMUNITY)
Admission: EM | Admit: 2019-11-11 | Discharge: 2019-11-11 | Disposition: A | Discharge status: Home / Self Care | Payer: BC Managed Care – PPO | Attending: Emergency Medicine | Admitting: Emergency Medicine

## 2019-11-11 DIAGNOSIS — L599 Disorder of the skin and subcutaneous tissue related to radiation, unspecified: Secondary | ICD-10-CM | POA: Diagnosis not present

## 2019-11-11 DIAGNOSIS — F17290 Nicotine dependence, other tobacco product, uncomplicated: Secondary | ICD-10-CM | POA: Insufficient documentation

## 2019-11-11 DIAGNOSIS — T467X5A Adverse effect of peripheral vasodilators, initial encounter: Secondary | ICD-10-CM | POA: Insufficient documentation

## 2019-11-11 DIAGNOSIS — R000 Tachycardia, unspecified: Secondary | ICD-10-CM | POA: Insufficient documentation

## 2019-11-11 DIAGNOSIS — L539 Erythematous condition, unspecified: Secondary | ICD-10-CM | POA: Insufficient documentation

## 2019-11-11 MED ORDER — ASPIRIN 81 MG PO CHEW
324.0000 mg | CHEWABLE_TABLET | Freq: Once | ORAL | Status: AC
  Administered 2019-11-11: 324 mg via ORAL
  Filled 2019-11-11: qty 4

## 2019-11-11 NOTE — ED Notes (Signed)
Patient verbalizes understanding of discharge instructions. Opportunity for questioning and answers were provided. Armband removed by staff, pt discharged from ED ambulatory to home.  

## 2019-11-11 NOTE — ED Triage Notes (Signed)
Pt arrives POV for eval of possible Niacin overdose. Pt reports that she took #4 500mg  Niacin tabs this morning, because she forgot to take her 2 yesterday. Endorses hot flashes, tachy in triage. Denies CP.

## 2019-11-11 NOTE — Discharge Instructions (Addendum)
Your symptoms are typical of large doses of niacin. It can be distressing but is not serious. They should improve by later tonight and should be completely gone by tomorrow morning.

## 2019-11-11 NOTE — Progress Notes (Signed)
Spoke w/ poison control, advised pt may experience "niacin flush" w/ generalized redness and a "warm all over feeling". Poison control RN reports this resolves w/ no treatment, and tachycardia should resolve when this feeling resolves as well. No other recommendations.

## 2019-11-13 ENCOUNTER — Other Ambulatory Visit: Payer: Self-pay

## 2019-11-13 ENCOUNTER — Ambulatory Visit (HOSPITAL_COMMUNITY)
Admission: EM | Admit: 2019-11-13 | Discharge: 2019-11-13 | Disposition: A | Discharge status: Home / Self Care | Payer: BC Managed Care – PPO | Attending: Family Medicine | Admitting: Family Medicine

## 2019-11-13 ENCOUNTER — Encounter (HOSPITAL_COMMUNITY): Payer: Self-pay

## 2019-11-13 DIAGNOSIS — J019 Acute sinusitis, unspecified: Secondary | ICD-10-CM | POA: Diagnosis not present

## 2019-11-13 MED ORDER — CETIRIZINE HCL 10 MG PO CAPS: 10.0000 mg | ORAL_CAPSULE | Freq: Every day | ORAL | 0 refills | Status: DC

## 2019-11-13 MED ORDER — AMOXICILLIN-POT CLAVULANATE 875-125 MG PO TABS: 1.0000 | ORAL_TABLET | Freq: Two times a day (BID) | ORAL | 0 refills | Status: AC

## 2019-11-13 MED ORDER — FLUTICASONE PROPIONATE 50 MCG/ACT NA SUSP: 1.0000 | Freq: Every day | NASAL | 0 refills | Status: DC

## 2019-11-13 NOTE — Discharge Instructions (Signed)
Begin Augmentin twice daily for the next week to cover sinus infection Flonase nasal spray 1 to 2 spray in each nostril daily Daily cetirizine/Zyrtec up with postnasal drainage/throat irritation May take Tylenol and ibuprofen for further sore throat Rest and fluids Honey and hot tea Follow-up if not improving or worsening

## 2019-11-13 NOTE — ED Triage Notes (Addendum)
Pt c/o sore throat x 1 week, taking ibuprofen and tylenol at home. States "It feels like something is stuck back there."  States she has had strep in the past and this does not feel like strep.

## 2019-11-15 NOTE — ED Provider Notes (Signed)
[facility] EMERGENCY DEPARTMENT Provider Note   CSN: [id number] Arrival date & time: 11/11/19  1740     History Chief Complaint  Patient presents with  . Drug Overdose    Carolyn Jenkins is a 19 y.o. female.  HPI   18yF with redness and itching. Took 2g of niacin. Normally takes 1,000 mg but missed yesterday and doubled today. Denies any other significant ingestion. No no respiratory or GI complaints.  No dizziness or lightheadedness.  Past Medical History:  Diagnosis Date  . Medical history non-contributory   . Recurrent tonsillitis     Patient Active Problem List   Diagnosis Date Noted  . S/P tonsillectomy 06/22/2019  . Peritonsillar abscess 05/10/2019  . Tonsillitis with exudate 05/10/2019    Past Surgical History:  Procedure Laterality Date  . NO PAST SURGERIES    . TONSILLECTOMY Bilateral 06/22/2019   Procedure: TONSILLECTOMY;  Surgeon: [name], MD;  Location: [facility];  Service: ENT;  Laterality: Bilateral;     OB History   No obstetric history on file.     History reviewed. No pertinent family history.  Social History   Tobacco Use  . Smoking status: Current Every Day Smoker    Types: Cigars  . Smokeless tobacco: Never Used  . Tobacco comment: 5 per day, Black and Milds  Substance Use Topics  . Alcohol use: No  . Drug use: Never    Home Medications Prior to Admission medications   Medication Sig Start Date End Date Taking? Authorizing Provider  amoxicillin-clavulanate (AUGMENTIN) 875-125 MG tablet Take 1 tablet by mouth every 12 (twelve) hours for 7 days. 11/13/19 11/20/19  Wieters, Hallie C, PA-C  Cetirizine HCl 10 MG CAPS Take 1 capsule (10 mg total) by mouth daily for 10 days. 11/13/19 11/23/19  Wieters, Hallie C, PA-C  fluticasone (FLONASE) 50 MCG/ACT nasal spray Place 1-2 sprays into both nostrils daily for 7 days. 11/13/19 11/20/19  Wieters, Hallie C, PA-C    Allergies    Patient has no known  allergies.  Review of Systems   Review of Systems All systems reviewed and negative, other than as noted in HPI.  Physical Exam Updated Vital Signs BP 134/70 (BP Location: Right Arm)   Pulse (!) 108   Temp 99.8 F (37.7 C) (Oral)   Resp 16   Ht 5\' 9"  (1.753 m)   Wt 136.1 kg   SpO2 100%   BMI 44.30 kg/m   Physical Exam Vitals and nursing note reviewed.  Constitutional:      General: She is not in acute distress.    Appearance: She is well-developed.  HENT:     Head: Normocephalic and atraumatic.  Eyes:     General:        Right eye: No discharge.        Left eye: No discharge.     Conjunctiva/sclera: Conjunctivae normal.  Cardiovascular:     Rate and Rhythm: Normal rate and regular rhythm.     Heart sounds: Normal heart sounds. No murmur heard.  No friction rub. No gallop.   Pulmonary:     Effort: Pulmonary effort is normal. No respiratory distress.     Breath sounds: Normal breath sounds.  Abdominal:     General: There is no distension.     Palpations: Abdomen is soft.     Tenderness: There is no abdominal tenderness.  Musculoskeletal:        General: No tenderness.     Cervical  back: Neck supple.  Skin:    General: Skin is warm and dry.  Neurological:     Mental Status: She is alert.  Psychiatric:        Behavior: Behavior normal.        Thought Content: Thought content normal.     ED Results / Procedures / Treatments   Labs (all labs ordered are listed, but only abnormal results are displayed) Labs Reviewed - No data to display  EKG EKG Interpretation  Date/Time:  Wednesday November 11 2019 21:10:01 EDT Ventricular Rate:  101 PR Interval:  144 QRS Duration: 64 QT Interval:  342 QTC Calculation: 443 R Axis:   56 Text Interpretation: Sinus tachycardia Otherwise normal ECG Confirmed by Raeford Razor [phone]) on 11/11/2019 9:18:36 PM   Radiology No results found.  Procedures Procedures (including critical care time)  Medications Ordered in  ED Medications  aspirin chewable tablet 324 mg (324 mg Oral Given 11/11/19 2132)    ED Course  I have reviewed the triage vital signs and the nursing notes.  Pertinent labs & imaging results that were available during my care of the patient were reviewed by me and considered in my medical decision making (see chart for details).    MDM Rules/Calculators/A&P                          18yF with flushing after 2g ingestion of niacin. Typical symptoms. Tachycardia improved w/o intervention. Given some aspirin. Reassured. Should be asymptomatic by the morning. Return precautions discussed.   Final Clinical Impression(s) / ED Diagnoses Final diagnoses:  Adverse effect of nicotinic acid, initial encounter    Rx / DC Orders ED Discharge Orders    None       Raeford Razor, MD 11/15/19 2253

## 2019-11-16 NOTE — ED Provider Notes (Signed)
[facility]    CSN: [id number] Arrival date & time: 11/13/19  1816      History   Chief Complaint Chief Complaint  Patient presents with   Sore Throat    HPI Carolyn Jenkins is a 19 y.o. female presenting today for evaluation of a sore throat.  Patient reports that she is having sore throat and congestion for approximately 1 week.  She has been using ibuprofen and Tylenol without relief.  Reports being fully vaccinated, declined Covid testing.  Denies fevers loss of taste or smell.  Denies any close sick contacts.  HPI  Past Medical History:  Diagnosis Date   Medical history non-contributory    Recurrent tonsillitis     Patient Active Problem List   Diagnosis Date Noted   S/P tonsillectomy 06/22/2019   Peritonsillar abscess 05/10/2019   Tonsillitis with exudate 05/10/2019    Past Surgical History:  Procedure Laterality Date   NO PAST SURGERIES     TONSILLECTOMY Bilateral 06/22/2019   Procedure: TONSILLECTOMY;  Surgeon: [name], MD;  Location: [facility];  Service: ENT;  Laterality: Bilateral;    OB History   No obstetric history on file.      Home Medications    Prior to Admission medications   Medication Sig Start Date End Date Taking? Authorizing Provider  amoxicillin-clavulanate (AUGMENTIN) 875-125 MG tablet Take 1 tablet by mouth every 12 (twelve) hours for 7 days. 11/13/19 11/20/19  Amara Justen C, PA-C  Cetirizine HCl 10 MG CAPS Take 1 capsule (10 mg total) by mouth daily for 10 days. 11/13/19 11/23/19  Lilyana Lippman C, PA-C  fluticasone (FLONASE) 50 MCG/ACT nasal spray Place 1-2 sprays into both nostrils daily for 7 days. 11/13/19 11/20/19  Tatijana Bierly, Junius Creamer, PA-C    Family History History reviewed. No pertinent family history.  Social History Social History   Tobacco Use   Smoking status: Current Every Day Smoker    Types: Cigars   Smokeless tobacco: Never Used   Tobacco comment: 5 per day, Black and Milds    Substance Use Topics   Alcohol use: No   Drug use: Never     Allergies   Patient has no known allergies.   Review of Systems Review of Systems  Constitutional: Negative for activity change, appetite change, chills, fatigue and fever.  HENT: Positive for congestion, rhinorrhea, sinus pressure and sore throat. Negative for ear pain and trouble swallowing.   Eyes: Negative for discharge and redness.  Respiratory: Negative for cough, chest tightness and shortness of breath.   Cardiovascular: Negative for chest pain.  Gastrointestinal: Negative for abdominal pain, diarrhea, nausea and vomiting.  Musculoskeletal: Negative for myalgias.  Skin: Negative for rash.  Neurological: Negative for dizziness, light-headedness and headaches.     Physical Exam Triage Vital Signs ED Triage Vitals  Enc Vitals Group     BP 11/13/19 1917 (!) 131/63     Pulse Rate 11/13/19 1917 97     Resp 11/13/19 1917 16     Temp 11/13/19 1917 98.5 F (36.9 C)     Temp src --      SpO2 11/13/19 1917 100 %     Weight --      Height --      Head Circumference --      Peak Flow --      Pain Score 11/13/19 1919 5     Pain Loc --      Pain Edu? --  Excl. in GC? --    No data found.  Updated Vital Signs BP (!) 131/63    Pulse 97    Temp 98.5 F (36.9 C)    Resp 16    LMP 11/13/2019    SpO2 100%   Visual Acuity Right Eye Distance:   Left Eye Distance:   Bilateral Distance:    Right Eye Near:   Left Eye Near:    Bilateral Near:     Physical Exam Vitals and nursing note reviewed.  Constitutional:      Appearance: She is well-developed.     Comments: No acute distress  HENT:     Head: Normocephalic and atraumatic.     Ears:     Comments: Bilateral ears without tenderness to palpation of external auricle, tragus and mastoid, EAC's without erythema or swelling, TM's with good bony landmarks and cone of light. Non erythematous.     Nose: Nose normal.     Comments: Nasal mucosa erythematous  with swollen turbinates    Mouth/Throat:     Comments: Oral mucosa pink and moist, no tonsillar enlargement or exudate. Posterior pharynx patent and nonerythematous, no uvula deviation or swelling. Normal phonation.  Eyes:     Conjunctiva/sclera: Conjunctivae normal.  Cardiovascular:     Rate and Rhythm: Normal rate.  Pulmonary:     Effort: Pulmonary effort is normal. No respiratory distress.     Comments: Breathing comfortably at rest, CTABL, no wheezing, rales or other adventitious sounds auscultated  Abdominal:     General: There is no distension.  Musculoskeletal:        General: Normal range of motion.     Cervical back: Neck supple.  Skin:    General: Skin is warm and dry.  Neurological:     Mental Status: She is alert and oriented to person, place, and time.      UC Treatments / Results  Labs (all labs ordered are listed, but only abnormal results are displayed) Labs Reviewed - No data to display  EKG   Radiology No results found.  Procedures Procedures (including critical care time)  Medications Ordered in UC Medications - No data to display  Initial Impression / Assessment and Plan / UC Course  I have reviewed the triage vital signs and the nursing notes.  Pertinent labs & imaging results that were available during my care of the patient were reviewed by me and considered in my medical decision making (see chart for details).     URI symptoms greater than 1 week, treating for sinusitis with Augmentin, Flonase and cetirizine.  Rest and fluids.  Discussed strict return precautions. Patient verbalized understanding and is agreeable with plan.  Final Clinical Impressions(s) / UC Diagnoses   Final diagnoses:  Acute sinusitis with symptoms > 10 days     Discharge Instructions     Begin Augmentin twice daily for the next week to cover sinus infection Flonase nasal spray 1 to 2 spray in each nostril daily Daily cetirizine/Zyrtec up with postnasal  drainage/throat irritation May take Tylenol and ibuprofen for further sore throat Rest and fluids Honey and hot tea Follow-up if not improving or worsening   ED Prescriptions    Medication Sig Dispense Auth. Provider   amoxicillin-clavulanate (AUGMENTIN) 875-125 MG tablet Take 1 tablet by mouth every 12 (twelve) hours for 7 days. 14 tablet Taquisha Phung C, PA-C   fluticasone (FLONASE) 50 MCG/ACT nasal spray Place 1-2 sprays into both nostrils daily for 7 days. 1 g  Jep Dyas C, PA-C   Cetirizine HCl 10 MG CAPS Take 1 capsule (10 mg total) by mouth daily for 10 days. 10 capsule Annmargaret Decaprio, Emmett C, PA-C     PDMP not reviewed this encounter.   Dyrell Tuccillo, [place] C, PA-C 11/16/19 1018

## 2020-08-03 ENCOUNTER — Other Ambulatory Visit: Payer: Self-pay

## 2020-08-03 ENCOUNTER — Encounter (HOSPITAL_COMMUNITY): Payer: Self-pay | Admitting: Emergency Medicine

## 2020-08-03 ENCOUNTER — Emergency Department (HOSPITAL_COMMUNITY)
Admission: EM | Admit: 2020-08-03 | Discharge: 2020-08-04 | Disposition: A | Discharge status: Home / Self Care | Payer: BC Managed Care – PPO | Attending: Emergency Medicine | Admitting: Emergency Medicine

## 2020-08-03 DIAGNOSIS — B349 Viral infection, unspecified: Secondary | ICD-10-CM | POA: Insufficient documentation

## 2020-08-03 DIAGNOSIS — F17290 Nicotine dependence, other tobacco product, uncomplicated: Secondary | ICD-10-CM | POA: Insufficient documentation

## 2020-08-03 DIAGNOSIS — Z23 Encounter for immunization: Secondary | ICD-10-CM | POA: Insufficient documentation

## 2020-08-03 DIAGNOSIS — N611 Abscess of the breast and nipple: Secondary | ICD-10-CM | POA: Insufficient documentation

## 2020-08-03 DIAGNOSIS — N63 Unspecified lump in unspecified breast: Secondary | ICD-10-CM | POA: Diagnosis present

## 2020-08-03 DIAGNOSIS — L0291 Cutaneous abscess, unspecified: Secondary | ICD-10-CM

## 2020-08-03 NOTE — ED Provider Notes (Signed)
MSE was initiated and I personally evaluated the patient and placed orders (if any) at  10:44 PM on August 03, 2020.  Right breast lump, painful. No redness or drainage. Seen by PCP today and advised to come here.   Today's Vitals   08/03/20 2155 08/03/20 2235  BP: 124/73   Pulse: 99   Resp: (!) 24   Temp: 98.9 F (37.2 C)   TempSrc: Oral   SpO2: 100%   PainSc:  7    There is no height or weight on file to calculate BMI.  Right midline breast with 2-3 cm induration, likely abscess.  The patient appears stable so that the remainder of the MSE may be completed by another provider.   Elpidio Anis, PA-C 08/03/20 2246    Cheryll Cockayne, MD 08/05/20 1345

## 2020-08-03 NOTE — ED Triage Notes (Signed)
Patient felt a lump at right upper breast yesterday with mild pain , no drainage , denies fever or chills .

## 2020-08-04 ENCOUNTER — Emergency Department (HOSPITAL_COMMUNITY)
Admission: EM | Admit: 2020-08-04 | Discharge: 2020-08-05 | Disposition: A | Discharge status: Home / Self Care | Payer: BC Managed Care – PPO | Source: Home / Self Care | Attending: Emergency Medicine | Admitting: Emergency Medicine

## 2020-08-04 ENCOUNTER — Encounter (HOSPITAL_COMMUNITY): Payer: Self-pay | Admitting: Emergency Medicine

## 2020-08-04 DIAGNOSIS — B349 Viral infection, unspecified: Secondary | ICD-10-CM | POA: Insufficient documentation

## 2020-08-04 DIAGNOSIS — F17290 Nicotine dependence, other tobacco product, uncomplicated: Secondary | ICD-10-CM | POA: Insufficient documentation

## 2020-08-04 DIAGNOSIS — N611 Abscess of the breast and nipple: Secondary | ICD-10-CM | POA: Diagnosis not present

## 2020-08-04 DIAGNOSIS — R000 Tachycardia, unspecified: Secondary | ICD-10-CM | POA: Insufficient documentation

## 2020-08-04 MED ORDER — TETANUS-DIPHTH-ACELL PERTUSSIS 5-2.5-18.5 LF-MCG/0.5 IM SUSY
0.5000 mL | PREFILLED_SYRINGE | Freq: Once | INTRAMUSCULAR | Status: AC
  Administered 2020-08-04: 0.5 mL via INTRAMUSCULAR
  Filled 2020-08-04: qty 0.5

## 2020-08-04 MED ORDER — LIDOCAINE HCL (PF) 1 % IJ SOLN
5.0000 mL | Freq: Once | INTRAMUSCULAR | Status: AC
  Administered 2020-08-04: 5 mL
  Filled 2020-08-04: qty 5

## 2020-08-04 MED ORDER — LIDOCAINE-EPINEPHRINE-TETRACAINE (LET) TOPICAL GEL
3.0000 mL | Freq: Once | TOPICAL | Status: AC
  Administered 2020-08-04: 3 mL via TOPICAL
  Filled 2020-08-04: qty 3

## 2020-08-04 NOTE — ED Provider Notes (Signed)
[facility] EMERGENCY DEPARTMENT Provider Note   CSN: [id number] Arrival date & time: 08/03/20  2145     History Chief Complaint  Patient presents with  . Breast Lump    Carolyn Jenkins is a 20 y.o. female.  HPI   Patient with no significant medical history presents to the emergency department with chief complaint of a lump on her breast.  She noticed this yesterday, states it is tender when she touches it and is painful when she wears a bra.  She denies overlying redness on the area, denies drainage or discharge from the area, denies  recent trauma.  She has no history of breast cancer, currently not breast-feeding, is not on her menstrual cycle no history of fibrocystic changes or  Abscesses. Patient is not immunocompromise, does not have diabetes.  She endorsed that she had subjective fevers and chills yesterday but this has since resolved.  She has no other complaints at this time.  Patient denies headaches, fevers, chills, shortness breath, chest pain abdominal pain, nausea, vomiting, diarrhea, worsening pedal edema.  Past Medical History:  Diagnosis Date  . Medical history non-contributory   . Recurrent tonsillitis     Patient Active Problem List   Diagnosis Date Noted  . S/P tonsillectomy 06/22/2019  . Peritonsillar abscess 05/10/2019  . Tonsillitis with exudate 05/10/2019    Past Surgical History:  Procedure Laterality Date  . NO PAST SURGERIES    . TONSILLECTOMY Bilateral 06/22/2019   Procedure: TONSILLECTOMY;  Surgeon: [name], MD;  Location: [facility];  Service: ENT;  Laterality: Bilateral;     OB History   No obstetric history on file.     No family history on file.  Social History   Tobacco Use  . Smoking status: Current Every Day Smoker    Types: Cigars  . Smokeless tobacco: Never Used  . Tobacco comment: 5 per day, Black and Milds  Substance Use Topics  . Alcohol use: No  . Drug use: Never    Home  Medications Prior to Admission medications   Not on File    Allergies    Patient has no known allergies.  Review of Systems   Review of Systems  Constitutional: Negative for chills and fever.  HENT: Negative for congestion.   Respiratory: Negative for shortness of breath.   Cardiovascular: Negative for chest pain.  Gastrointestinal: Negative for abdominal pain, diarrhea and vomiting.  Genitourinary: Negative for enuresis and flank pain.  Musculoskeletal: Negative for back pain.  Skin: Positive for wound. Negative for rash.       Lump on her breasts  Neurological: Negative for dizziness and headaches.  Hematological: Does not bruise/bleed easily.    Physical Exam Updated Vital Signs BP 122/75   Pulse 90   Temp 98.9 F (37.2 C) (Oral)   Resp (!) 21   SpO2 100%   Physical Exam Vitals and nursing note reviewed. Exam conducted with a chaperone present.  Constitutional:      General: She is not in acute distress.    Appearance: Normal appearance. She is not ill-appearing or diaphoretic.  HENT:     Head: Normocephalic and atraumatic.     Nose: No congestion.  Eyes:     Conjunctiva/sclera: Conjunctivae normal.  Cardiovascular:     Rate and Rhythm: Normal rate and regular rhythm.  Pulmonary:     Effort: Pulmonary effort is normal.  Musculoskeletal:     Cervical back: Neck supple.  Skin:  General: Skin is warm and dry.     Coloration: Skin is not jaundiced or pale.     Comments: With chaperone present breast exam was performed, there is a noted small erythematous patch between patient's breasts, it was warm to the touch, no noted drainage or discharge present.  It was tender to palpation, fluctuant in induration present.  Neurological:     Mental Status: She is alert and oriented to person, place, and time.  Psychiatric:        Mood and Affect: Mood normal.     ED Results / Procedures / Treatments   Labs (all labs ordered are listed, but only abnormal results are  displayed) Labs Reviewed - No data to display  EKG None  Radiology No results found.  Procedures .Marland KitchenIncision and Drainage  Date/Time: 08/04/2020 4:02 AM Performed by: Carroll Sage, PA-C Authorized by: Carroll Sage, PA-C   Consent:    Consent obtained:  Verbal   Consent given by:  Patient   Risks discussed:  Bleeding, incomplete drainage, pain, damage to other organs and infection   Alternatives discussed:  No treatment Universal protocol:    Patient identity confirmed:  Verbally with patient Location:    Type:  Abscess   Size:  3cm   Location:  Trunk   Trunk location:  Chest Pre-procedure details:    Skin preparation:  Antiseptic wash Sedation:    Sedation type:  None Anesthesia:    Anesthesia method:  Topical application and local infiltration   Topical anesthetic:  LET   Local anesthetic:  Lidocaine 1% w/o epi Procedure type:    Complexity:  Simple Procedure details:    Ultrasound guidance: no     Needle aspiration: no     Incision types:  Single straight   Incision depth:  Dermal   Wound management:  Probed and deloculated   Drainage:  Purulent and bloody   Drainage amount:  Moderate   Wound treatment:  Wound left open   Packing materials:  None Post-procedure details:    Procedure completion:  Tolerated well, no immediate complications     Medications Ordered in ED Medications  lidocaine-EPINEPHrine-tetracaine (LET) topical gel (3 mLs Topical Given 08/04/20 0314)  lidocaine (PF) (XYLOCAINE) 1 % injection 5 mL (5 mLs Infiltration Given by Other 08/04/20 0331)  Tdap (BOOSTRIX) injection 0.5 mL (0.5 mLs Intramuscular Given 08/04/20 0330)    ED Course  I have reviewed the triage vital signs and the nursing notes.  Pertinent labs & imaging results that were available during my care of the patient were reviewed by me and considered in my medical decision making (see chart for details).    MDM Rules/Calculators/A&P                          Initial impression-patient presents with lump on her breast.  She is alert, does not appear acute distress, vital signs reassuring.  Suspect patient suffering from an abscess.  Will recommend I&D for healing, update tetanus   Work-up-due to well-appearing patient, benign physical exam further lab work and imaging not warranted at this time.  Reassessment patient was agreeable for I&D.  Patient with skin abscess  located sternum between the breasts, amenable to incision and drainage.  Patient tolerated procedure well.  Abscess was not large enough to warrant packing or drain.   Rule out-low suspicion for systemic infection as patient is nontoxic-appearing, vital signs reassuring.  Low suspicion for overlying cellulitis  requiring antibiotics there is only slight erythema noted in the area, patient is not immunocompromise, patient has no systemic symptoms will defer antibiotics at this time.  Plan-patient abscess that was drained, will recommend warm compresses, over-the-counter pain medications, follow-up in the next 4 days for wound check.  Vital signs have remained stable, no indication for hospital admission.  Patient discussed with attending and they agreed with assessment and plan.  Patient given at home care as well strict return precautions.  Patient verbalized that they understood agreed to said plan.   Final Clinical Impression(s) / ED Diagnoses Final diagnoses:  Abscess    Rx / DC Orders ED Discharge Orders    None       Carroll Sage, PA-C 08/04/20 0405    Gilda Crease, MD 08/04/20 [phone]

## 2020-08-04 NOTE — Discharge Instructions (Addendum)
You had an abscess that was drained here in the emergency department.  I want you to apply warm compresses to the area 3 times a day for next 4 days as we want it to drain.  You may take over-the-counter pain medications like ibuprofen and/or Tylenol every 6 or as needed please follow dosing the back of bottle.  I would like you to follow-up with your primary care doctor, urgent care, or this department for recheck of your abscess.  Come back to the emergency department if you develop chest pain, shortness of breath, severe abdominal pain, uncontrolled nausea, vomiting, diarrhea.

## 2020-08-04 NOTE — ED Triage Notes (Signed)
Pt reports that she was at [facility] last night and had a lump on her R breast drained yesterday. States that she vomited once earlier today and feels like she can't get a full breath. A&Ox4. No distress.

## 2020-08-05 DIAGNOSIS — N611 Abscess of the breast and nipple: Secondary | ICD-10-CM | POA: Diagnosis not present

## 2020-08-05 MED ORDER — ONDANSETRON 4 MG PO TBDP: 4.0000 mg | ORAL_TABLET | Freq: Three times a day (TID) | ORAL | 0 refills | Status: DC | PRN

## 2020-08-05 MED ORDER — ONDANSETRON 4 MG PO TBDP
4.0000 mg | ORAL_TABLET | Freq: Once | ORAL | Status: AC
  Administered 2020-08-05: 4 mg via ORAL
  Filled 2020-08-05: qty 1

## 2020-08-05 NOTE — ED Notes (Signed)
Pt took few sips of water.Pt tolerated well.

## 2020-08-05 NOTE — ED Provider Notes (Signed)
[facility] EMERGENCY DEPARTMENT Provider Note  CSN: [id number] Arrival date & time: 08/04/20 2334    History Chief Complaint  Patient presents with  . Shortness of Breath    HPI  Carolyn Jenkins is a 20 y.o. female presents to the ED for evaluation of general ill feeling. She was in the [facility] about 24 hours ago for an abscess on her sternal area. Drained but not packed. No meds given at discharge. She went home around 4am and went to bed. Woke up a few hours later feeling nauseated and vomited once. She has had a mild cough and feeling like she cannot get a full deep breath since then. No fevers. She has not tried eating or drinking anything. She denies any abdominal pain.    Past Medical History:  Diagnosis Date  . Medical history non-contributory   . Recurrent tonsillitis     Past Surgical History:  Procedure Laterality Date  . NO PAST SURGERIES    . TONSILLECTOMY Bilateral 06/22/2019   Procedure: TONSILLECTOMY;  Surgeon: [name], MD;  Location: [facility];  Service: ENT;  Laterality: Bilateral;    History reviewed. No pertinent family history.  Social History   Tobacco Use  . Smoking status: Current Every Day Smoker    Types: Cigars  . Smokeless tobacco: Never Used  . Tobacco comment: 5 per day, Black and Milds  Substance Use Topics  . Alcohol use: No  . Drug use: Never     Home Medications Prior to Admission medications   Medication Sig Start Date End Date Taking? Authorizing Provider  ondansetron (ZOFRAN ODT) 4 MG disintegrating tablet Take 1 tablet (4 mg total) by mouth every 8 (eight) hours as needed for nausea or vomiting. 08/05/20  Yes Pollyann Savoy, MD     Allergies    Patient has no known allergies.   Review of Systems   Review of Systems A comprehensive review of systems was completed and negative except as noted in HPI.    Physical Exam BP (!) 148/76 (BP Location: Right Arm)   Pulse 98   Temp 98.8 F (37.1 C) (Oral)    Resp (!) 30   SpO2 100%   Physical Exam Vitals and nursing note reviewed.  Constitutional:      Appearance: Normal appearance.  HENT:     Head: Normocephalic and atraumatic.     Nose: Nose normal.     Mouth/Throat:     Mouth: Mucous membranes are moist.  Eyes:     Extraocular Movements: Extraocular movements intact.     Conjunctiva/sclera: Conjunctivae normal.  Cardiovascular:     Rate and Rhythm: Tachycardia present.  Pulmonary:     Effort: Pulmonary effort is normal. No tachypnea, accessory muscle usage or respiratory distress.     Breath sounds: Normal breath sounds.  Abdominal:     General: Abdomen is flat.     Palpations: Abdomen is soft.     Tenderness: There is no abdominal tenderness.  Musculoskeletal:        General: No swelling. Normal range of motion.     Cervical back: Neck supple.  Skin:    General: Skin is warm and dry.  Neurological:     General: No focal deficit present.     Mental Status: She is alert.  Psychiatric:        Mood and Affect: Mood normal.      ED Results / Procedures / Treatments   Labs (all labs ordered are  listed, but only abnormal results are displayed) Labs Reviewed - No data to display  EKG None  Radiology No results found.  Procedures Procedures  Medications Ordered in the ED Medications  ondansetron (ZOFRAN-ODT) disintegrating tablet 4 mg (4 mg Oral Given 08/05/20 0302)     MDM Rules/Calculators/A&P MDM Patient with vague symptoms of nausea and SOB. She is mildly tachycardic at times but no hypoxia. Her exam is benign. Low suspicion for PE, PNA, PTX or other acute life-threatening cardiopulmonary process. She still has some nausea and/or fear of eating so will give a dose of Zofran and PO trial.  ED Course  I have reviewed the triage vital signs and the nursing notes.  Pertinent labs & imaging results that were available during my care of the patient were reviewed by me and considered in my medical decision  making (see chart for details).  Clinical Course as of 08/05/20 0530  Fri Aug 05, 2020  0529 Patient tolerating PO fluids, still well appearing and eager to go home.  [CS]    Clinical Course User Index [CS] Pollyann Savoy, MD    Final Clinical Impression(s) / ED Diagnoses Final diagnoses:  Viral syndrome    Rx / DC Orders ED Discharge Orders         Ordered    ondansetron (ZOFRAN ODT) 4 MG disintegrating tablet  Every 8 hours PRN        08/05/20 0529           Pollyann Savoy, MD 08/05/20 0530

## 2020-11-16 ENCOUNTER — Emergency Department (HOSPITAL_COMMUNITY)
Admission: EM | Admit: 2020-11-16 | Discharge: 2020-11-17 | Disposition: A | Discharge status: Home / Self Care | Payer: BC Managed Care – PPO | Attending: Emergency Medicine | Admitting: Emergency Medicine

## 2020-11-16 ENCOUNTER — Encounter (HOSPITAL_COMMUNITY): Payer: Self-pay

## 2020-11-16 ENCOUNTER — Other Ambulatory Visit: Payer: Self-pay

## 2020-11-16 DIAGNOSIS — K2970 Gastritis, unspecified, without bleeding: Secondary | ICD-10-CM | POA: Diagnosis not present

## 2020-11-16 DIAGNOSIS — F17290 Nicotine dependence, other tobacco product, uncomplicated: Secondary | ICD-10-CM | POA: Diagnosis not present

## 2020-11-16 DIAGNOSIS — U071 COVID-19: Secondary | ICD-10-CM

## 2020-11-16 DIAGNOSIS — R0602 Shortness of breath: Secondary | ICD-10-CM | POA: Diagnosis not present

## 2020-11-16 DIAGNOSIS — R1012 Left upper quadrant pain: Secondary | ICD-10-CM | POA: Diagnosis present

## 2020-11-16 LAB — URINALYSIS, ROUTINE W REFLEX MICROSCOPIC
Bilirubin Urine: NEGATIVE
Glucose, UA: NEGATIVE mg/dL
Hgb urine dipstick: NEGATIVE
Ketones, ur: NEGATIVE mg/dL
Leukocytes,Ua: NEGATIVE
Nitrite: NEGATIVE
Protein, ur: NEGATIVE mg/dL
Specific Gravity, Urine: 1.023 (ref 1.005–1.030)
pH: 5 (ref 5.0–8.0)

## 2020-11-16 LAB — COMPREHENSIVE METABOLIC PANEL
ALT: 24 U/L (ref 0–44)
AST: 21 U/L (ref 15–41)
Albumin: 4 g/dL (ref 3.5–5.0)
Alkaline Phosphatase: 69 U/L (ref 38–126)
Anion gap: 8 (ref 5–15)
BUN: 6 mg/dL (ref 6–20)
CO2: 22 mmol/L (ref 22–32)
Calcium: 8.5 mg/dL — ABNORMAL LOW (ref 8.9–10.3)
Chloride: 105 mmol/L (ref 98–111)
Creatinine, Ser: 0.75 mg/dL (ref 0.44–1.00)
GFR, Estimated: >60 mL/min (ref >60)
Glucose, Bld: 100 mg/dL — ABNORMAL HIGH (ref 70–99)
Potassium: 3.5 mmol/L (ref 3.5–5.1)
Sodium: 135 mmol/L (ref 135–145)
Total Bilirubin: 0.5 mg/dL (ref 0.3–1.2)
Total Protein: 7.8 g/dL (ref 6.5–8.1)

## 2020-11-16 LAB — CBC WITH DIFFERENTIAL/PLATELET
Abs Immature Granulocytes: 0.02 10*3/uL (ref 0.00–0.07)
Basophils Absolute: 0 10*3/uL (ref 0.0–0.1)
Basophils Relative: 0 %
Eosinophils Absolute: 0 10*3/uL (ref 0.0–0.5)
Eosinophils Relative: 0 %
HCT: 35.5 % — ABNORMAL LOW (ref 36.0–46.0)
Hemoglobin: 11.6 g/dL — ABNORMAL LOW (ref 12.0–15.0)
Immature Granulocytes: 0 %
Lymphocytes Relative: 23 %
Lymphs Abs: 1.2 10*3/uL (ref 0.7–4.0)
MCH: 28 pg (ref 26.0–34.0)
MCHC: 32.7 g/dL (ref 30.0–36.0)
MCV: 85.7 fL (ref 80.0–100.0)
Monocytes Absolute: 0.6 10*3/uL (ref 0.1–1.0)
Monocytes Relative: 12 %
Neutro Abs: 3.3 10*3/uL (ref 1.7–7.7)
Neutrophils Relative %: 65 %
Platelets: 289 10*3/uL (ref 150–400)
RBC: 4.14 MIL/uL (ref 3.87–5.11)
RDW: 15.4 % (ref 11.5–15.5)
WBC: 5 10*3/uL (ref 4.0–10.5)
nRBC: 0 % (ref 0.0–0.2)

## 2020-11-16 LAB — I-STAT BETA HCG BLOOD, ED (MC, WL, AP ONLY): I-stat hCG, quantitative: <5 m[IU]/mL (ref <5)

## 2020-11-16 LAB — LIPASE, BLOOD: Lipase: 26 U/L (ref 11–51)

## 2020-11-16 NOTE — ED Provider Notes (Signed)
Emergency Medicine Provider Triage Evaluation Note  Carolyn Jenkins , a 20 y.o. female  was evaluated in triage.  Pt complains of left side pain.  Patient states that she has been having pain for the past couple of days, was seen in [place] with extensive work-up for similar.  Patient states that pain is gotten better.  Also admits to a dry cough, states that she was tested for COVID which was negative.  Review of Systems  Positive: Left side pain of abdomen Negative: Fevers  Physical Exam  There were no vitals taken for this visit. Gen:   Awake, no distress   Resp:  Normal effort  MSK:   Moves extremities without difficulty  Other:  Left upper quadrant and left lower quadrant abdominal pain, no guarding.  Medical Decision Making  Medically screening exam initiated at 9:09 PM.  Appropriate orders placed.  Shia Delaine was informed that the remainder of the evaluation will be completed by another provider, this initial triage assessment does not replace that evaluation, and the importance of remaining in the ED until their evaluation is complete.     Farrel Gordon, PA-C 11/16/20 2114    Rozelle Logan, DO 11/17/20 0015

## 2020-11-16 NOTE — ED Triage Notes (Signed)
Pt BIB EMS from home. Pt endorses "spleen" pain. Pt reports being seen in ER for same in [place] before. Pt also has a dry cough.

## 2020-11-17 ENCOUNTER — Encounter (HOSPITAL_COMMUNITY): Payer: Self-pay

## 2020-11-17 ENCOUNTER — Emergency Department (HOSPITAL_COMMUNITY): Payer: BC Managed Care – PPO

## 2020-11-17 LAB — RESP PANEL BY RT-PCR (FLU A&B, COVID) ARPGX2
Influenza A by PCR: NEGATIVE
Influenza B by PCR: NEGATIVE
SARS Coronavirus 2 by RT PCR: POSITIVE — AB

## 2020-11-17 LAB — D-DIMER, QUANTITATIVE: D-Dimer, Quant: 0.61 ug/mL-FEU — ABNORMAL HIGH (ref 0.00–0.50)

## 2020-11-17 MED ORDER — PANTOPRAZOLE SODIUM 40 MG PO TBEC: 40.0000 mg | DELAYED_RELEASE_TABLET | Freq: Every day | ORAL | 3 refills | Status: DC

## 2020-11-17 MED ORDER — NIRMATRELVIR/RITONAVIR (PAXLOVID)TABLET: 3.0000 | ORAL_TABLET | Freq: Two times a day (BID) | ORAL | 0 refills | Status: AC

## 2020-11-17 MED ORDER — IOHEXOL 350 MG/ML SOLN
100.0000 mL | Freq: Once | INTRAVENOUS | Status: AC | PRN
  Administered 2020-11-17: 100 mL via INTRAVENOUS

## 2020-11-17 MED ORDER — SODIUM CHLORIDE 0.9 % IV BOLUS
1000.0000 mL | Freq: Once | INTRAVENOUS | Status: AC
  Administered 2020-11-17: 1000 mL via INTRAVENOUS

## 2020-11-17 MED ORDER — DICYCLOMINE HCL 20 MG PO TABS: 20.0000 mg | ORAL_TABLET | Freq: Three times a day (TID) | ORAL | 0 refills | Status: DC

## 2020-11-17 NOTE — ED Provider Notes (Addendum)
[facility]-EMERGENCY DEPT Provider Note   CSN: [id number] Arrival date & time: 11/16/20  2102     History Chief Complaint  Patient presents with   Abdominal Pain    Carolyn Jenkins is a 20 y.o. female.  Patient presents to the emergency department with multiple complaints.  Patient reports that she has been having left upper abdominal pain.  She reports that she did have this when she was living in [place] and at that time was told that it was because she has an enlarged spleen.  No treatment was recommended.  Patient now experiencing some upper respiratory symptoms as well with cough and chest congestion with mild shortness of breath.      Past Medical History:  Diagnosis Date   Medical history non-contributory    Recurrent tonsillitis     Patient Active Problem List   Diagnosis Date Noted   S/P tonsillectomy 06/22/2019   Peritonsillar abscess 05/10/2019   Tonsillitis with exudate 05/10/2019    Past Surgical History:  Procedure Laterality Date   NO PAST SURGERIES     TONSILLECTOMY Bilateral 06/22/2019   Procedure: TONSILLECTOMY;  Surgeon: Serena Colonelosen, Jefry, MD;  Location: [facility];  Service: ENT;  Laterality: Bilateral;     OB History   No obstetric history on file.     History reviewed. No pertinent family history.  Social History   Tobacco Use   Smoking status: Every Day    Types: Cigars   Smokeless tobacco: Never   Tobacco comments:    5 per day, Black and Milds  Substance Use Topics   Alcohol use: No   Drug use: Never    Home Medications Prior to Admission medications   Medication Sig Start Date End Date Taking? Authorizing Provider  ondansetron (ZOFRAN ODT) 4 MG disintegrating tablet Take 1 tablet (4 mg total) by mouth every 8 (eight) hours as needed for nausea or vomiting. 08/05/20   Pollyann SavoySheldon, Charles B, MD    Allergies    Patient has no known allergies.  Review of Systems   Review of Systems  HENT:  Positive for  congestion.   Respiratory:  Positive for cough and shortness of breath.   Gastrointestinal:  Positive for abdominal pain.  All other systems reviewed and are negative.  Physical Exam Updated Vital Signs BP (!) 146/97   Pulse 90   Temp 98.4 F (36.9 C) (Oral)   Resp 17   Ht 5\' 10"  (1.778 m)   SpO2 98%   BMI 43.05 kg/m   Physical Exam Vitals and nursing note reviewed.  Constitutional:      General: She is not in acute distress.    Appearance: Normal appearance. She is well-developed.  HENT:     Head: Normocephalic and atraumatic.     Right Ear: Hearing normal.     Left Ear: Hearing normal.     Nose: Nose normal.  Eyes:     Conjunctiva/sclera: Conjunctivae normal.     Pupils: Pupils are equal, round, and reactive to light.  Cardiovascular:     Rate and Rhythm: Regular rhythm.     Heart sounds: S1 normal and S2 normal. No murmur heard.   No friction rub. No gallop.  Pulmonary:     Effort: Pulmonary effort is normal. No respiratory distress.     Breath sounds: Normal breath sounds.  Chest:     Chest wall: No tenderness.  Abdominal:     General: Bowel sounds are normal.  Palpations: Abdomen is soft.     Tenderness: There is abdominal tenderness in the left upper quadrant. There is no guarding or rebound. Negative signs include Murphy's sign and McBurney's sign.     Hernia: No hernia is present.  Musculoskeletal:        General: Normal range of motion.     Cervical back: Normal range of motion and neck supple.  Skin:    General: Skin is warm and dry.     Findings: No rash.  Neurological:     Mental Status: She is alert and oriented to person, place, and time.     GCS: GCS eye subscore is 4. GCS verbal subscore is 5. GCS motor subscore is 6.     Cranial Nerves: No cranial nerve deficit.     Sensory: No sensory deficit.     Coordination: Coordination normal.  Psychiatric:        Speech: Speech normal.        Behavior: Behavior normal.        Thought Content:  Thought content normal.    ED Results / Procedures / Treatments   Labs (all labs ordered are listed, but only abnormal results are displayed) Labs Reviewed  RESP PANEL BY RT-PCR (FLU A&B, COVID) ARPGX2 - Abnormal; Notable for the following components:      Result Value   SARS Coronavirus 2 by RT PCR POSITIVE (*)    All other components within normal limits  CBC WITH DIFFERENTIAL/PLATELET - Abnormal; Notable for the following components:   Hemoglobin 11.6 (*)    HCT 35.5 (*)    All other components within normal limits  COMPREHENSIVE METABOLIC PANEL - Abnormal; Notable for the following components:   Glucose, Bld 100 (*)    Calcium 8.5 (*)    All other components within normal limits  URINALYSIS, ROUTINE W REFLEX MICROSCOPIC - Abnormal; Notable for the following components:   APPearance HAZY (*)    All other components within normal limits  D-DIMER, QUANTITATIVE - Abnormal; Notable for the following components:   D-Dimer, Quant 0.61 (*)    All other components within normal limits  LIPASE, BLOOD  I-STAT BETA HCG BLOOD, ED ([facility], [facility], [facility] ONLY)    EKG None  Radiology CT Angio Chest Pulmonary Embolism (PE) W or WO Contrast  Result Date: 11/17/2020 CLINICAL DATA:  20 year old female with nonproductive cough. Abnormal D-dimer. Abdominal pain, left side pain. EXAM: CT ANGIOGRAPHY CHEST CT ABDOMEN AND PELVIS WITH CONTRAST TECHNIQUE: Multidetector CT imaging of the chest was performed using the standard protocol during bolus administration of intravenous contrast. Multiplanar CT image reconstructions and MIPs were obtained to evaluate the vascular anatomy. Multidetector CT imaging of the abdomen and pelvis was performed using the standard protocol during bolus administration of intravenous contrast. CONTRAST:  OMNIPAQUE IOHEXOL 350 MG/ML SOLN COMPARISON:  CTA chest 07/04/2019. FINDINGS: CTA CHEST FINDINGS Cardiovascular: Suboptimal but adequate contrast bolus timing in the pulmonary  arterial tree. Mild respiratory motion. Mild cardiac pulsation motion. No central or hilar pulmonary artery filling defect. No convincing lobar, segmental or branch pulmonary artery filling defect. Cardiac size remains within normal limits. No pericardial effusion. Negative visible aorta. Mediastinum/Nodes: Negative. Lungs/Pleura: Major airways are patent. Stable somewhat low lung volumes compared to last year. Both lungs remain clear. No pleural effusion. Musculoskeletal: Stable, negative. Review of the MIP images confirms the above findings. CT ABDOMEN and PELVIS FINDINGS Hepatobiliary: Negative liver and gallbladder. Pancreas: Negative. Spleen: Intact spleen with estimated splenic volume 286 mL (  normal splenic volume range 83 - 412 mL). No perisplenic fluid. Adrenals/Urinary Tract: Normal adrenal glands. Renal enhancement is symmetric and within normal limits. No hydronephrosis. No nephrolithiasis is evident. No perinephric stranding. Decompressed ureters. Diminutive and unremarkable bladder. Stomach/Bowel: Negative large bowel, mildly redundant hepatic flexure and sigmoid. Normal appendix tracks into the right hemipelvis on coronal image 54. Terminal ileum is decompressed and negative. No dilated small bowel. Relatively decompressed stomach. Negative duodenum. No free air, free fluid, mesenteric inflammation. Vascular/Lymphatic: Major arterial structures appear patent and normal in the abdomen and pelvis. Portal venous system is patent. No lymphadenopathy. Reproductive: Within normal limits. Other: No pelvic free fluid. Musculoskeletal: Negative. Review of the MIP images confirms the above findings. IMPRESSION: 1. Negative for acute pulmonary embolus. 2. No acute or inflammatory finding identified in the chest, abdomen, or pelvis. Electronically Signed   By: Odessa Fleming M.D.   On: 11/17/2020 04:58   CT ABDOMEN PELVIS W CONTRAST  Result Date: 11/17/2020 CLINICAL DATA:  20 year old female with nonproductive cough.  Abnormal D-dimer. Abdominal pain, left side pain. EXAM: CT ANGIOGRAPHY CHEST CT ABDOMEN AND PELVIS WITH CONTRAST TECHNIQUE: Multidetector CT imaging of the chest was performed using the standard protocol during bolus administration of intravenous contrast. Multiplanar CT image reconstructions and MIPs were obtained to evaluate the vascular anatomy. Multidetector CT imaging of the abdomen and pelvis was performed using the standard protocol during bolus administration of intravenous contrast. CONTRAST:  OMNIPAQUE IOHEXOL 350 MG/ML SOLN COMPARISON:  CTA chest 07/04/2019. FINDINGS: CTA CHEST FINDINGS Cardiovascular: Suboptimal but adequate contrast bolus timing in the pulmonary arterial tree. Mild respiratory motion. Mild cardiac pulsation motion. No central or hilar pulmonary artery filling defect. No convincing lobar, segmental or branch pulmonary artery filling defect. Cardiac size remains within normal limits. No pericardial effusion. Negative visible aorta. Mediastinum/Nodes: Negative. Lungs/Pleura: Major airways are patent. Stable somewhat low lung volumes compared to last year. Both lungs remain clear. No pleural effusion. Musculoskeletal: Stable, negative. Review of the MIP images confirms the above findings. CT ABDOMEN and PELVIS FINDINGS Hepatobiliary: Negative liver and gallbladder. Pancreas: Negative. Spleen: Intact spleen with estimated splenic volume 286 mL (normal splenic volume range 83 - 412 mL). No perisplenic fluid. Adrenals/Urinary Tract: Normal adrenal glands. Renal enhancement is symmetric and within normal limits. No hydronephrosis. No nephrolithiasis is evident. No perinephric stranding. Decompressed ureters. Diminutive and unremarkable bladder. Stomach/Bowel: Negative large bowel, mildly redundant hepatic flexure and sigmoid. Normal appendix tracks into the right hemipelvis on coronal image 54. Terminal ileum is decompressed and negative. No dilated small bowel. Relatively decompressed  stomach. Negative duodenum. No free air, free fluid, mesenteric inflammation. Vascular/Lymphatic: Major arterial structures appear patent and normal in the abdomen and pelvis. Portal venous system is patent. No lymphadenopathy. Reproductive: Within normal limits. Other: No pelvic free fluid. Musculoskeletal: Negative. Review of the MIP images confirms the above findings. IMPRESSION: 1. Negative for acute pulmonary embolus. 2. No acute or inflammatory finding identified in the chest, abdomen, or pelvis. Electronically Signed   By: Odessa Fleming M.D.   On: 11/17/2020 04:58    Procedures Procedures   Medications Ordered in ED Medications  sodium chloride 0.9 % bolus 1,000 mL (0 mLs Intravenous Stopped 11/17/20 0402)  iohexol (OMNIPAQUE) 350 MG/ML injection 100 mL (100 mLs Intravenous Contrast Given 11/17/20 0413)    ED Course  I have reviewed the triage vital signs and the nursing notes.  Pertinent labs & imaging results that were available during my care of the patient were reviewed by  me and considered in my medical decision making (see chart for details).    MDM Rules/Calculators/A&P                          Patient presents to the emergency department with complaints of abdominal pain.  Patient complaining of left upper abdominal pain.  Patient reports that she had similar pain previously and was told that she had an enlarged spleen.  Examination reveals tenderness in the region but no focality.  No guarding or rebound.  Patient mildly tachycardic at arrival.  This has improved with analgesia and IV fluids.  She also reports upper respiratory infection symptoms.  Patient has tested positive for COVID.  CT angiography performed, no evidence of PE or other lung pathology.  Final Clinical Impression(s) / ED Diagnoses Final diagnoses:  COVID-19  Left upper quadrant abdominal pain    Rx / DC Orders ED Discharge Orders     None        Willena Jeancharles, Canary Brim, MD 11/17/20 [id number]    Gilda Crease, MD 11/17/20 [phone]

## 2021-01-24 ENCOUNTER — Ambulatory Visit (HOSPITAL_COMMUNITY)
Admission: EM | Admit: 2021-01-24 | Discharge: 2021-01-25 | Disposition: A | Discharge status: Home / Self Care | Payer: BC Managed Care – PPO | Attending: Nurse Practitioner | Admitting: Nurse Practitioner

## 2021-01-24 ENCOUNTER — Other Ambulatory Visit: Payer: Self-pay

## 2021-01-24 DIAGNOSIS — Z79899 Other long term (current) drug therapy: Secondary | ICD-10-CM | POA: Diagnosis not present

## 2021-01-24 DIAGNOSIS — Z9151 Personal history of suicidal behavior: Secondary | ICD-10-CM | POA: Insufficient documentation

## 2021-01-24 DIAGNOSIS — Z20822 Contact with and (suspected) exposure to covid-19: Secondary | ICD-10-CM | POA: Diagnosis not present

## 2021-01-24 DIAGNOSIS — Z62810 Personal history of physical and sexual abuse in childhood: Secondary | ICD-10-CM | POA: Diagnosis not present

## 2021-01-24 DIAGNOSIS — F332 Major depressive disorder, recurrent severe without psychotic features: Secondary | ICD-10-CM | POA: Insufficient documentation

## 2021-01-24 DIAGNOSIS — F4310 Post-traumatic stress disorder, unspecified: Secondary | ICD-10-CM | POA: Insufficient documentation

## 2021-01-24 DIAGNOSIS — F17290 Nicotine dependence, other tobacco product, uncomplicated: Secondary | ICD-10-CM | POA: Diagnosis not present

## 2021-01-24 LAB — CBC WITH DIFFERENTIAL/PLATELET
Abs Immature Granulocytes: 0.02 10*3/uL (ref 0.00–0.07)
Basophils Absolute: 0 10*3/uL (ref 0.0–0.1)
Basophils Relative: 0 %
Eosinophils Absolute: 0.1 10*3/uL (ref 0.0–0.5)
Eosinophils Relative: 2 %
HCT: 36.8 % (ref 36.0–46.0)
Hemoglobin: 12.1 g/dL (ref 12.0–15.0)
Immature Granulocytes: 0 %
Lymphocytes Relative: 26 %
Lymphs Abs: 1.8 10*3/uL (ref 0.7–4.0)
MCH: 27.9 pg (ref 26.0–34.0)
MCHC: 32.9 g/dL (ref 30.0–36.0)
MCV: 85 fL (ref 80.0–100.0)
Monocytes Absolute: 0.5 10*3/uL (ref 0.1–1.0)
Monocytes Relative: 7 %
Neutro Abs: 4.5 10*3/uL (ref 1.7–7.7)
Neutrophils Relative %: 65 %
Platelets: 356 10*3/uL (ref 150–400)
RBC: 4.33 MIL/uL (ref 3.87–5.11)
RDW: 14.2 % (ref 11.5–15.5)
WBC: 6.9 10*3/uL (ref 4.0–10.5)
nRBC: 0 % (ref 0.0–0.2)

## 2021-01-24 LAB — COMPREHENSIVE METABOLIC PANEL
ALT: 17 U/L (ref 0–44)
AST: 15 U/L (ref 15–41)
Albumin: 3.9 g/dL (ref 3.5–5.0)
Alkaline Phosphatase: 62 U/L (ref 38–126)
Anion gap: 8 (ref 5–15)
BUN: <5 mg/dL — ABNORMAL LOW (ref 6–20)
CO2: 26 mmol/L (ref 22–32)
Calcium: 9.4 mg/dL (ref 8.9–10.3)
Chloride: 103 mmol/L (ref 98–111)
Creatinine, Ser: 0.63 mg/dL (ref 0.44–1.00)
GFR, Estimated: >60 mL/min (ref >60)
Glucose, Bld: 91 mg/dL (ref 70–99)
Potassium: 4 mmol/L (ref 3.5–5.1)
Sodium: 137 mmol/L (ref 135–145)
Total Bilirubin: 0.4 mg/dL (ref 0.3–1.2)
Total Protein: 7.4 g/dL (ref 6.5–8.1)

## 2021-01-24 LAB — POCT URINE DRUG SCREEN - MANUAL ENTRY (I-SCREEN)
POC Amphetamine UR: NOT DETECTED
POC Buprenorphine (BUP): NOT DETECTED
POC Cocaine UR: NOT DETECTED
POC Marijuana UR: NOT DETECTED
POC Methadone UR: NOT DETECTED
POC Methamphetamine UR: NOT DETECTED
POC Morphine: NOT DETECTED
POC Oxazepam (BZO): NOT DETECTED
POC Oxycodone UR: NOT DETECTED
POC Secobarbital (BAR): NOT DETECTED

## 2021-01-24 LAB — LIPID PANEL
Cholesterol: 176 mg/dL (ref 0–200)
HDL: 42 mg/dL (ref >40)
LDL Cholesterol: 124 mg/dL — ABNORMAL HIGH (ref 0–99)
Total CHOL/HDL Ratio: 4.2 RATIO
Triglycerides: 50 mg/dL (ref <150)
VLDL: 10 mg/dL (ref 0–40)

## 2021-01-24 LAB — HEMOGLOBIN A1C
Hgb A1c MFr Bld: 5.5 % (ref 4.8–5.6)
Mean Plasma Glucose: 111.15 mg/dL

## 2021-01-24 LAB — POCT PREGNANCY, URINE: Preg Test, Ur: NEGATIVE

## 2021-01-24 LAB — POC SARS CORONAVIRUS 2 AG -  ED: SARS Coronavirus 2 Ag: NEGATIVE

## 2021-01-24 MED ORDER — MAGNESIUM HYDROXIDE 400 MG/5ML PO SUSP
30.0000 mL | Freq: Every day | ORAL | Status: DC | PRN
Start: 2021-01-24 — End: 2021-01-25

## 2021-01-24 MED ORDER — HYDROXYZINE HCL 25 MG PO TABS
25.0000 mg | ORAL_TABLET | Freq: Three times a day (TID) | ORAL | Status: DC | PRN
  Administered 2021-01-24: 25 mg via ORAL
  Filled 2021-01-24: qty 1

## 2021-01-24 MED ORDER — ACETAMINOPHEN 325 MG PO TABS
650.0000 mg | ORAL_TABLET | Freq: Four times a day (QID) | ORAL | Status: DC | PRN
Start: 2021-01-24 — End: 2021-01-25

## 2021-01-24 MED ORDER — SERTRALINE HCL 25 MG PO TABS
25.0000 mg | ORAL_TABLET | Freq: Every day | ORAL | Status: DC
  Administered 2021-01-24 – 2021-01-25 (×2): 25 mg via ORAL
  Filled 2021-01-24 (×2): qty 1

## 2021-01-24 MED ORDER — ALUM & MAG HYDROXIDE-SIMETH 200-200-20 MG/5ML PO SUSP
30.0000 mL | ORAL | Status: DC | PRN
Start: 2021-01-24 — End: 2021-01-25

## 2021-01-24 MED ORDER — TRAZODONE HCL 50 MG PO TABS
50.0000 mg | ORAL_TABLET | Freq: Every evening | ORAL | Status: DC | PRN
  Administered 2021-01-24: 50 mg via ORAL
  Filled 2021-01-24: qty 1
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
## 2021-01-24 NOTE — ED Notes (Signed)
Pt sleeping@this time. Breathing even and unlabored. Will continue to monitor for safety 
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
## 2021-01-25 LAB — RESP PANEL BY RT-PCR (FLU A&B, COVID) ARPGX2
Influenza A by PCR: NEGATIVE
Influenza B by PCR: NEGATIVE
SARS Coronavirus 2 by RT PCR: NEGATIVE

## 2021-01-25 LAB — TSH: TSH: 1.702 u[IU]/mL (ref 0.350–4.500)

## 2021-01-25 MED ORDER — SERTRALINE HCL 25 MG PO TABS: 25.0000 mg | ORAL_TABLET | Freq: Every day | ORAL | 0 refills | Status: AC

## 2021-01-25 MED ORDER — SEMAGLUTIDE (1 MG/DOSE) 4 MG/3ML ~~LOC~~ SOPN: 1.0000 mg | PEN_INJECTOR | SUBCUTANEOUS | Status: DC

## 2021-01-25 MED ORDER — TRAZODONE HCL 50 MG PO TABS: 50.0000 mg | ORAL_TABLET | Freq: Every evening | ORAL | 0 refills | Status: AC | PRN

## 2021-01-25 MED ORDER — VITAMIN D (ERGOCALCIFEROL) 1.25 MG (50000 UNIT) PO CAPS: 50000.0000 [IU] | ORAL_CAPSULE | ORAL | Status: DC

## 2021-01-25 MED ORDER — DIETHYLPROPION HCL ER 75 MG PO TB24: 1.0000 | ORAL_TABLET | Freq: Every day | ORAL | Status: DC

## 2021-01-25 NOTE — Discharge Instructions (Addendum)
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
## 2021-01-25 NOTE — ED Notes (Signed)
Safe transport contacted to take pt home.  Address confirmed by both pt and pt's mother.  Will continue to monitor for safety.
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
## 2021-01-25 NOTE — ED Notes (Signed)
Pt alert sitting up in chair watching tv.  Breakfast provided to the pt along with supplies to perform ADL care this morning.  No complaints of pain or discomfort at this time. Breathing is even and unlabored. Will continue to monitor for safety.

## 2021-01-25 NOTE — ED Notes (Signed)
Pt sleeping@this time. Breathing even and unlabored. Will continue to monitor for safety 

## 2021-01-25 NOTE — ED Notes (Signed)
Ptt given breakfast.

## 2021-01-25 NOTE — ED Notes (Signed)
Pt discharged with  AVS.  AVS reviewed prior to discharge.  Pt alert, oriented, and ambulatory.  Safety maintained.  °

## 2021-05-21 IMAGING — CT CT NECK W/ CM
3 of 4 series · 14 of 33 positions shown, 17 images · IV contrast (Omni 300)
Comparison: None.

CLINICAL DATA: Sore throat

EXAM:
CT NECK WITH CONTRAST
TECHNIQUE: Multidetector CT imaging of the neck was performed using the
standard protocol following the bolus administration of intravenous
contrast.
CONTRAST:  75mL OMNIPAQUE IOHEXOL 300 MG/ML  SOLN

[Series 3: neck 2.0 st · axial · 0.43mm/px · z∈[-289,-133]mm · 6 of 98 slices shown, 8 images (1 of 3)]
[im 10/98  soft-tissue]
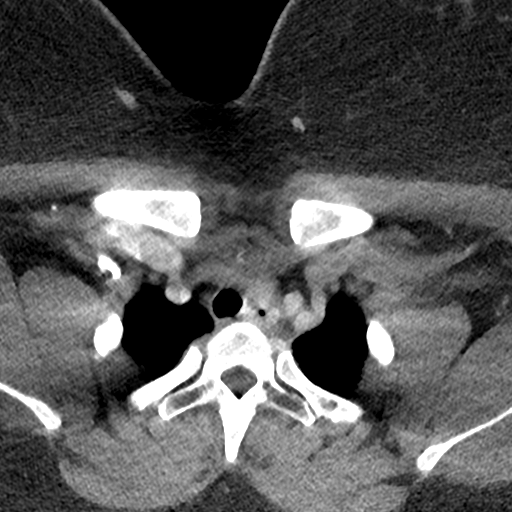
[im 10/98  bone]
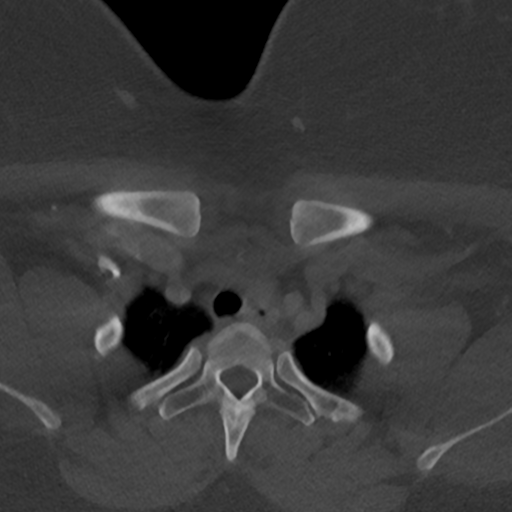
[im 30/98  bone]
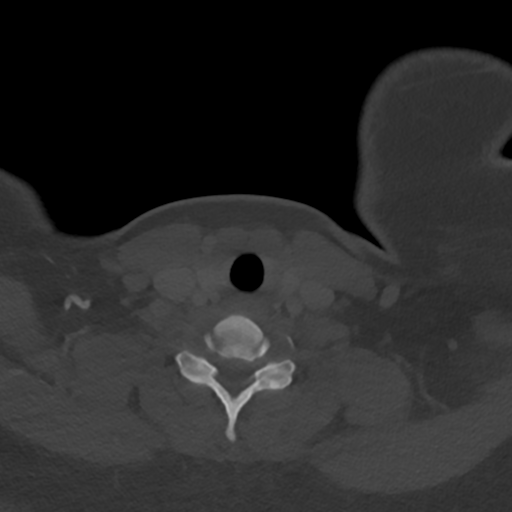
[im 39/98  bone]
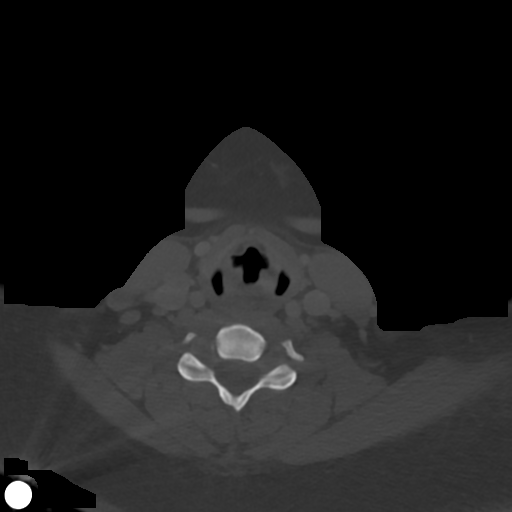
[im 59/98  bone]
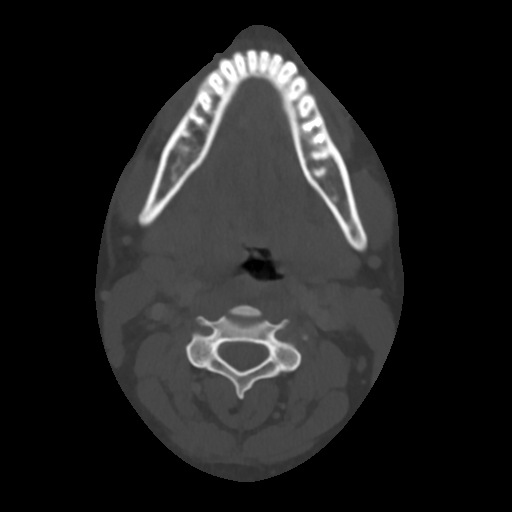
[im 68/98  soft-tissue]
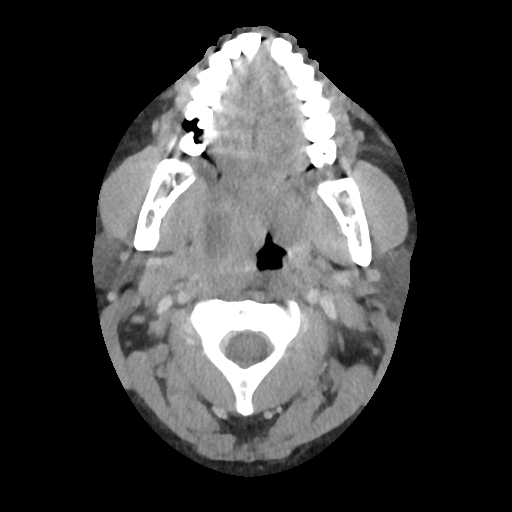
[im 68/98  bone]
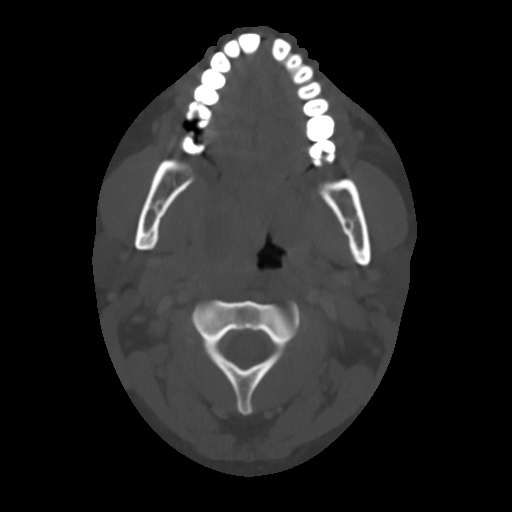
[im 88/98  bone]
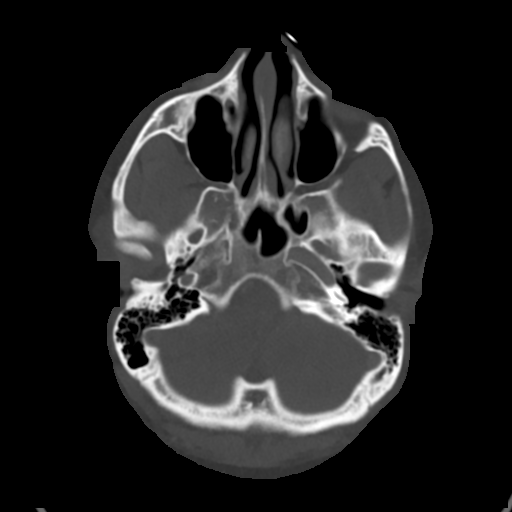

[Series 6: neck 2.0 st · sagittal · 0.39mm/px · 5 of 101 slices shown, 6 images (2 of 3)]
[im 34/101  bone]
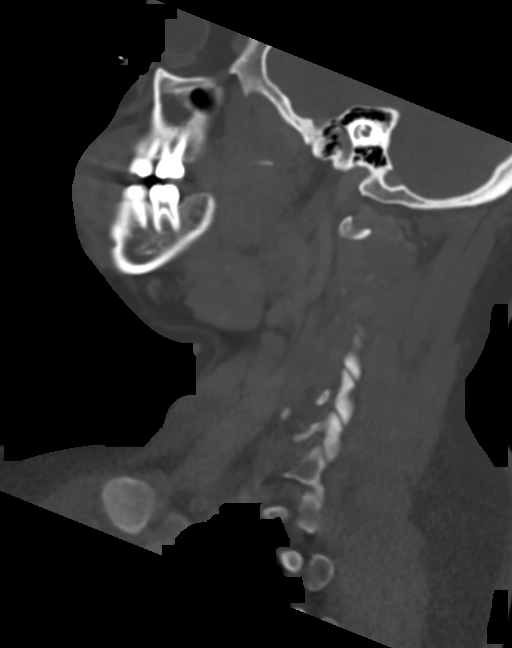
[im 42/101  bone]
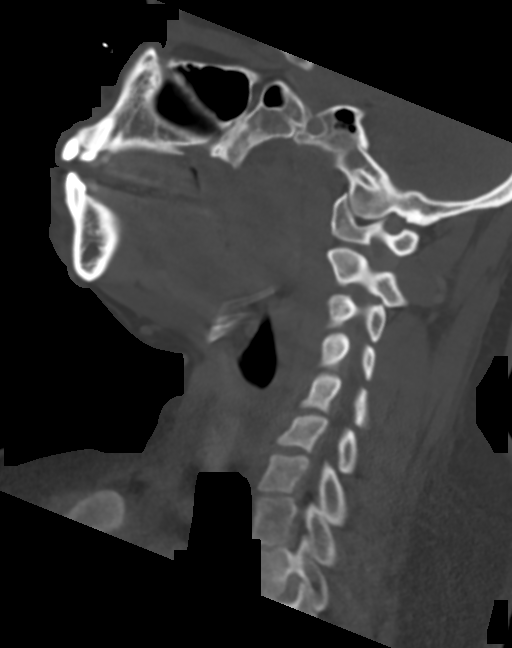
[im 51/101  soft-tissue]
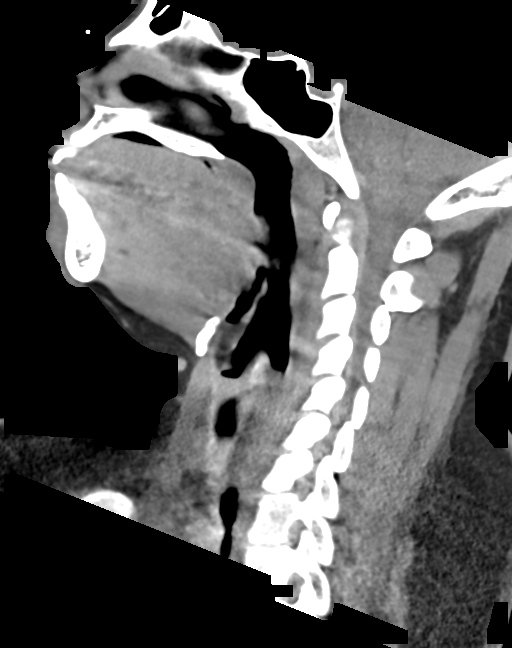
[im 51/101  bone]
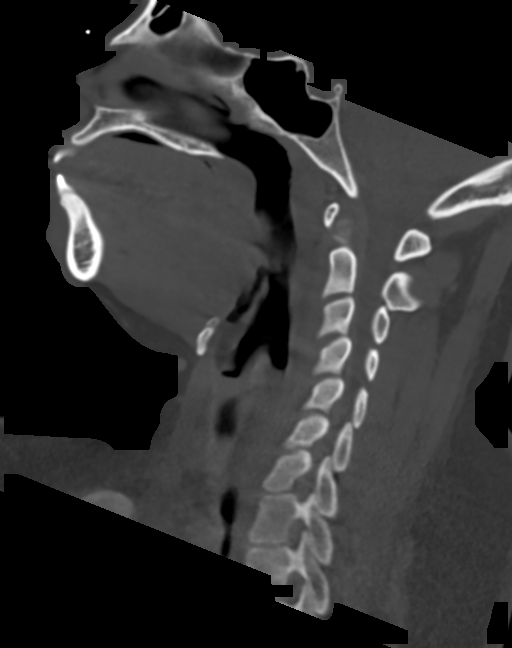
[im 59/101  bone]
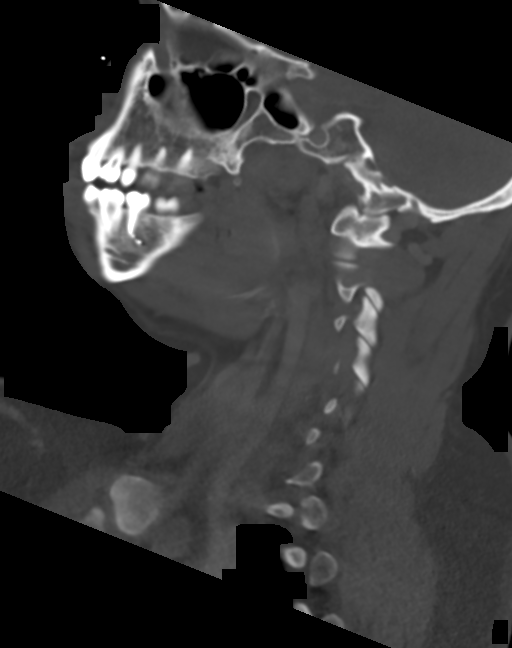
[im 67/101  bone]
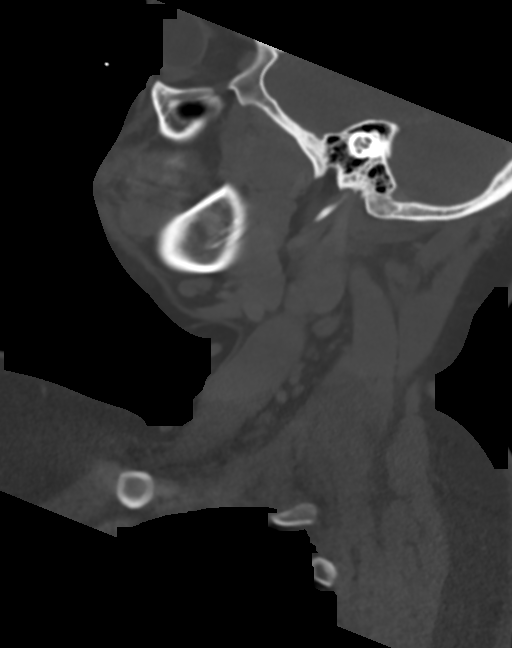

[Series 7: neck 2.0 st · coronal · 0.43mm/px · 3 of 101 slices shown (3 of 3)]
[im 34/101  bone]
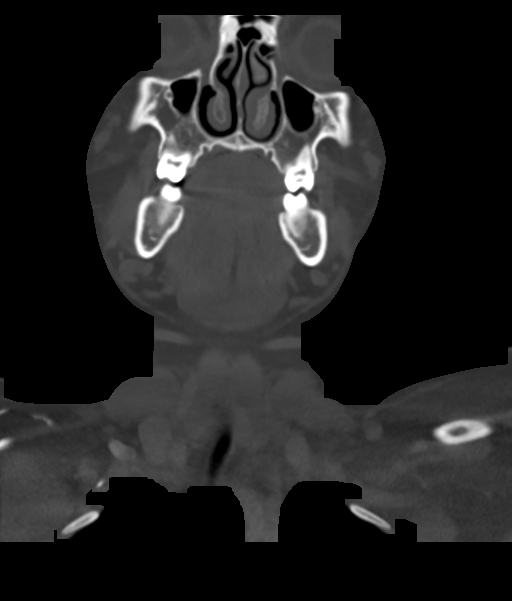
[im 45/101  bone]
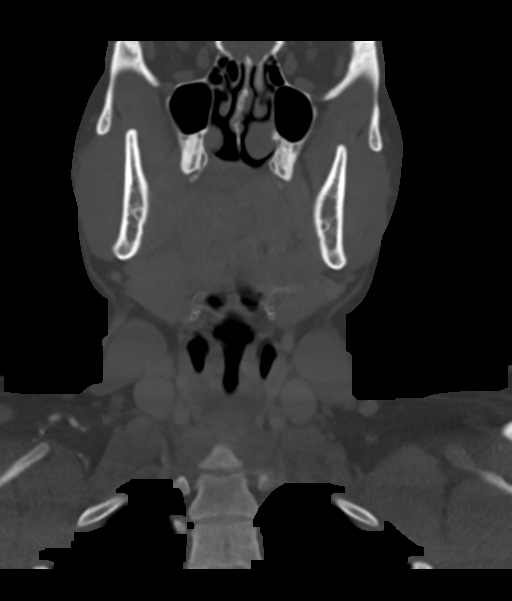
[im 56/101  bone]
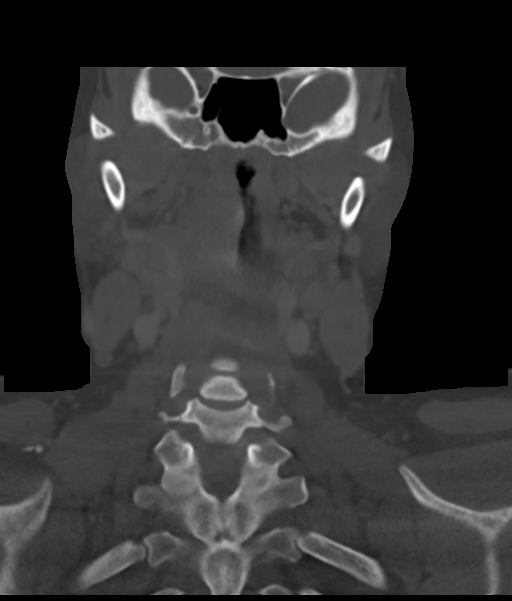

[14 of 33 positions shown; findings below may reference images not displayed]

FINDINGS: Pharynx and larynx: There is asymmetric right palatine tonsil
enlargement. Ill-defined low-attenuation is present, but there is
also significant streak artifact and this may be artifactual.
Epiglottis is normal. Airway is patent.

Salivary glands: Parotid and submandibular glands.

Thyroid: Unremarkable.

Lymph nodes: There are no enlarged lymph nodes.

Vascular: Major neck vessels are patent.

Limited intracranial: No abnormal enhancement.

Visualized orbits: Unremarkable

Mastoids and visualized paranasal sinuses: Aerated

Skeleton: Mild reversal of cervical lordosis.  Normal

Upper chest: Included lung apices are clear.

Other: None.
IMPRESSION: Asymmetric right palatine tonsil enlargement. Ill-defined low
attenuation is present, but this may be artifactual and there is no
definite abscess.

## 2021-10-30 ENCOUNTER — Ambulatory Visit (HOSPITAL_COMMUNITY)
Admission: EM | Admit: 2021-10-30 | Discharge: 2021-10-30 | Disposition: A | Discharge status: Home / Self Care | Payer: BC Managed Care – PPO | Attending: Family Medicine | Admitting: Family Medicine

## 2021-10-30 ENCOUNTER — Encounter (HOSPITAL_COMMUNITY): Payer: Self-pay | Admitting: *Deleted

## 2021-10-30 ENCOUNTER — Other Ambulatory Visit: Payer: Self-pay

## 2021-10-30 DIAGNOSIS — Z202 Contact with and (suspected) exposure to infections with a predominantly sexual mode of transmission: Secondary | ICD-10-CM | POA: Diagnosis present

## 2021-10-30 NOTE — Discharge Instructions (Addendum)
Staff will notify you of any of the blood tests or tests on the swab are positive
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
## 2021-10-30 NOTE — ED Notes (Signed)
This Careers adviser could not obtain blood samples
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
## 2021-10-31 LAB — CERVICOVAGINAL ANCILLARY ONLY
Bacterial Vaginitis (gardnerella): NEGATIVE
Candida Glabrata: NEGATIVE
Candida Vaginitis: NEGATIVE
Chlamydia: NEGATIVE
Comment: NEGATIVE
Comment: NEGATIVE
Comment: NEGATIVE
Comment: NEGATIVE
Comment: NEGATIVE
Comment: NORMAL
Neisseria Gonorrhea: NEGATIVE
Trichomonas: NEGATIVE

## 2022-04-25 ENCOUNTER — Emergency Department (HOSPITAL_COMMUNITY)
Admission: EM | Admit: 2022-04-25 | Discharge: 2022-04-26 | Disposition: A | Discharge status: Home / Self Care | Payer: BC Managed Care – PPO | Attending: Emergency Medicine | Admitting: Emergency Medicine

## 2022-04-25 ENCOUNTER — Other Ambulatory Visit: Payer: Self-pay

## 2022-04-25 DIAGNOSIS — L02412 Cutaneous abscess of left axilla: Secondary | ICD-10-CM | POA: Insufficient documentation

## 2022-04-25 NOTE — ED Notes (Signed)
No answer for triage.

## 2022-04-25 NOTE — ED Notes (Signed)
No answer x 2 for triage.

## 2022-04-25 NOTE — ED Triage Notes (Signed)
Pt reports reoccurring boil under the left arm (axilla), came back two days ago. Denies drainage.

## 2022-04-26 MED ORDER — SULFAMETHOXAZOLE-TRIMETHOPRIM 800-160 MG PO TABS
1.0000 | ORAL_TABLET | Freq: Once | ORAL | Status: AC
  Administered 2022-04-26: 1 via ORAL
  Filled 2022-04-26: qty 1

## 2022-04-26 MED ORDER — SULFAMETHOXAZOLE-TRIMETHOPRIM 800-160 MG PO TABS: 1.0000 | ORAL_TABLET | Freq: Two times a day (BID) | ORAL | 0 refills | Status: AC

## 2022-04-26 MED ORDER — LIDOCAINE-EPINEPHRINE (PF) 2 %-1:200000 IJ SOLN
10.0000 mL | Freq: Once | INTRAMUSCULAR | Status: AC
  Administered 2022-04-26: 10 mL
  Filled 2022-04-26: qty 20

## 2022-04-26 NOTE — ED Provider Notes (Signed)
[place] [facility] EMERGENCY DEPARTMENT Provider Note   CSN: [id number] Arrival date & time: 04/25/22  1817     History  Chief Complaint  Patient presents with   Abscess    Carolyn Jenkins is a 22 y.o. female here for evaluation of abscess to left axilla.  Has had recurring abscesses to her axilla.  Small area drained a few days ago however has a larger area.  No fever.  No prior diagnosis of hidradenitis suppurativa.  No redness or warmth.  Has not followed with general surgery for abscesses previously.  Tetanus up-to-date in 2022  HPI     Home Medications Prior to Admission medications   Medication Sig Start Date End Date Taking? Authorizing Provider  sulfamethoxazole-trimethoprim (BACTRIM DS) 800-160 MG tablet Take 1 tablet by mouth 2 (two) times daily for 7 days. 04/26/22 05/03/22 Yes Sandor Arboleda A, PA-C  ferrous sulfate 325 (65 FE) MG tablet Take 325 mg by mouth daily.    [provider]  OZEMPIC, 1 MG/DOSE, 4 MG/3ML SOPN Inject 1 mg into the skin every Tuesday. 01/02/21   [provider]  sertraline (ZOLOFT) 25 MG tablet Take 1 tablet (25 mg total) by mouth daily. 01/26/21   Lucky Rathke, FNP  traZODone (DESYREL) 50 MG tablet Take 1 tablet (50 mg total) by mouth at bedtime as needed for sleep. 01/25/21   Lucky Rathke, FNP  Vitamin D, Ergocalciferol, (DRISDOL) 1.25 MG (50000 UNIT) CAPS capsule Take 50,000 Units by mouth every Tuesday. 01/02/21   [provider]      Allergies    Patient has no known allergies.    Review of Systems   Review of Systems  Constitutional: Negative.   HENT: Negative.    Respiratory: Negative.    Cardiovascular: Negative.   Gastrointestinal: Negative.   Genitourinary: Negative.   Musculoskeletal: Negative.   Skin:        Abscess to left axilla  Neurological: Negative.   All other systems reviewed and are negative.   Physical Exam Updated Vital Signs BP (!) 100/57   Pulse (!) 106   Temp 97.8 F  (36.6 C)   Resp 18   LMP 04/11/2022   SpO2 100%  Physical Exam Vitals and nursing note reviewed.  Constitutional:      General: She is not in acute distress.    Appearance: She is well-developed. She is not ill-appearing, toxic-appearing or diaphoretic.  HENT:     Head: Normocephalic and atraumatic.  Eyes:     Pupils: Pupils are equal, round, and reactive to light.  Cardiovascular:     Rate and Rhythm: Normal rate and regular rhythm.  Pulmonary:     Effort: Pulmonary effort is normal. No respiratory distress.  Abdominal:     General: There is no distension.     Palpations: Abdomen is soft.  Musculoskeletal:        General: Normal range of motion.     Cervical back: Normal range of motion and neck supple.     Comments: No bony tenderness, full range of motion  Skin:    General: Skin is warm and dry.     Capillary Refill: Capillary refill takes less than 2 seconds.     Comments: Fluctuance, induration to left axilla  Neurological:     General: No focal deficit present.     Mental Status: She is alert and oriented to person, place, and time.     ED Results / Procedures / Treatments  Labs (all labs ordered are listed, but only abnormal results are displayed) Labs Reviewed - No data to display  EKG None  Radiology No results found.  Procedures Ultrasound ED Soft Tissue  Date/Time: 04/26/2022 11:09 AM  Performed by: Nettie Elm, PA-C Authorized by: Nettie Elm, PA-C   Procedure details:    Indications: localization of abscess and evaluate for cellulitis     Transverse view:  Visualized   Longitudinal view:  Visualized   Images: archived     Limitations:  Body habitus and positioning Location:    Location: axilla     Side:  Left Findings:     abscess present    cellulitis present    no foreign body present .Marland KitchenIncision and Drainage  Date/Time: 04/26/2022 11:11 AM  Performed by: Nettie Elm, PA-C Authorized by: Nettie Elm, PA-C    Consent:    Consent obtained:  Verbal   Consent given by:  Patient   Risks discussed:  Bleeding, incomplete drainage, pain and damage to other organs   Alternatives discussed:  No treatment Universal protocol:    Procedure explained and questions answered to patient or proxy's satisfaction: yes     Relevant documents present and verified: yes     Test results available : yes     Imaging studies available: yes     Required blood products, implants, devices, and special equipment available: yes     Site/side marked: yes     Immediately prior to procedure, a time out was called: yes     Patient identity confirmed:  Verbally with patient Location:    Type:  Abscess   Location:  Upper extremity   Upper extremity location: axilla. Pre-procedure details:    Skin preparation:  Betadine Anesthesia:    Anesthesia method:  Local infiltration   Local anesthetic:  Lidocaine 1% WITH epi Procedure type:    Complexity:  Complex Procedure details:    Incision types:  Single straight   Incision depth:  Subcutaneous   Wound management:  Probed and deloculated, irrigated with saline and extensive cleaning   Drainage:  Purulent   Drainage amount:  Copious   Packing materials:  1/4 in gauze Post-procedure details:    Procedure completion:  Tolerated well, no immediate complications     Medications Ordered in ED Medications  lidocaine-EPINEPHrine (XYLOCAINE W/EPI) 2 %-1:200000 (PF) injection 10 mL (10 mLs Infiltration Given 04/26/22 0948)  sulfamethoxazole-trimethoprim (BACTRIM DS) 800-160 MG per tablet 1 tablet (1 tablet Oral Given 04/26/22 0948)    ED Course/ Medical Decision Making/ A&P     22 year old here for evaluation of left axillary abscess.  On exam she has fluctuance induration.  I performed bedside ultrasound which does show abscess with overlying cobblestoning indicating cellulitis.  Abscess does not extend into breast tissue or chest wall.  Unfortunately she has had recurrent  issues.  She is scarring from old abscesses.  I suspect she likely has some degree of hidradenitis suppurativa underlying her recurring abscesses.  Thankfully she denies any systemic symptoms.  See procedure note.  Patient tolerated well.  Patient had copious purulent drainage drained from her left axillary abscess.  Packing was placed.  I discussed warm compress.  Given evidence of cobblestoning on ultrasound will start on p.o. antibiotics.  Encouraged return in 2 to 3 days for wound recheck suspect she will likely need repacking at that time given amount of drainage from abscess.  Return sooner for any new or worsening symptoms  The  patient has been appropriately medically screened and/or stabilized in the ED. I have low suspicion for any other emergent medical condition which would require further screening, evaluation or treatment in the ED or require inpatient management.  Patient is hemodynamically stable and in no acute distress.  Patient able to ambulate in department prior to ED.  Evaluation does not show acute pathology that would require ongoing or additional emergent interventions while in the emergency department or further inpatient treatment.  I have discussed the diagnosis with the patient and answered all questions.  Pain is been managed while in the emergency department and patient has no further complaints prior to discharge.  Patient is comfortable with plan discussed in room and is stable for discharge at this time.  I have discussed strict return precautions for returning to the emergency department.  Patient was encouraged to follow-up with PCP/specialist refer to at discharge.                             Medical Decision Making Amount and/or Complexity of Data Reviewed External Data Reviewed: radiology and notes. Radiology: ordered and independent interpretation performed. Decision-making details documented in ED Course.  Risk OTC drugs. Prescription drug  management. Decision regarding hospitalization. Diagnosis or treatment significantly limited by social determinants of health.           Final Clinical Impression(s) / ED Diagnoses Final diagnoses:  Abscess of left axilla    Rx / DC Orders ED Discharge Orders          Ordered    sulfamethoxazole-trimethoprim (BACTRIM DS) 800-160 MG tablet  2 times daily        04/26/22 1112              Tashari Schoenfelder A, PA-C 04/26/22 1113    Lonell Grandchild, MD 04/27/22 1237

## 2022-04-26 NOTE — Discharge Instructions (Signed)
Packing placed in your abscess.  Make sure to use warm compress in the area.  We want this area to continue draining.  Follow-up in 2 to 3 days for wound recheck.  This can be done at urgent care or back here in the emergency department.  Take the antibiotics as prescribed.  Return sooner for any new or worsening symptoms such as severe fever, increased pain

## 2022-04-26 NOTE — ED Notes (Addendum)
Social worker [place] spoke to patient about following court order in place.  States she only needed transportation back to her sister house.  Taxi voucher going to be given at discharge.  No new orders at this time.
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
## 2022-11-09 DIAGNOSIS — Z9049 Acquired absence of other specified parts of digestive tract: Secondary | ICD-10-CM | POA: Diagnosis not present

## 2022-11-09 DIAGNOSIS — Z881 Allergy status to other antibiotic agents status: Secondary | ICD-10-CM | POA: Diagnosis not present

## 2022-11-09 DIAGNOSIS — M79601 Pain in right arm: Secondary | ICD-10-CM | POA: Diagnosis not present

## 2022-11-10 DIAGNOSIS — M79601 Pain in right arm: Secondary | ICD-10-CM | POA: Diagnosis not present

## 2023-09-18 ENCOUNTER — Other Ambulatory Visit: Payer: Self-pay

## 2023-09-18 ENCOUNTER — Emergency Department (HOSPITAL_COMMUNITY)
Admission: EM | Admit: 2023-09-18 | Discharge: 2023-09-19 | Disposition: A | Discharge status: Home / Self Care | Attending: Emergency Medicine | Admitting: Emergency Medicine

## 2023-09-18 ENCOUNTER — Encounter (HOSPITAL_COMMUNITY): Payer: Self-pay | Admitting: Emergency Medicine

## 2023-09-18 DIAGNOSIS — N3001 Acute cystitis with hematuria: Secondary | ICD-10-CM | POA: Diagnosis not present

## 2023-09-18 DIAGNOSIS — N760 Acute vaginitis: Secondary | ICD-10-CM | POA: Insufficient documentation

## 2023-09-18 DIAGNOSIS — N898 Other specified noninflammatory disorders of vagina: Secondary | ICD-10-CM

## 2023-09-18 DIAGNOSIS — I10 Essential (primary) hypertension: Secondary | ICD-10-CM | POA: Diagnosis not present

## 2023-09-18 DIAGNOSIS — Z79899 Other long term (current) drug therapy: Secondary | ICD-10-CM | POA: Diagnosis not present

## 2023-09-18 DIAGNOSIS — B9689 Other specified bacterial agents as the cause of diseases classified elsewhere: Secondary | ICD-10-CM

## 2023-09-18 LAB — URINALYSIS, ROUTINE W REFLEX MICROSCOPIC
Bilirubin Urine: NEGATIVE
Glucose, UA: NEGATIVE mg/dL
Ketones, ur: NEGATIVE mg/dL
Nitrite: NEGATIVE
Protein, ur: 100 mg/dL — AB
RBC / HPF: >50 RBC/hpf (ref 0–5)
Specific Gravity, Urine: 1.015 (ref 1.005–1.030)
WBC, UA: >50 WBC/hpf (ref 0–5)
pH: 6 (ref 5.0–8.0)

## 2023-09-18 LAB — PREGNANCY, URINE: Preg Test, Ur: NEGATIVE

## 2023-09-18 NOTE — ED Triage Notes (Signed)
 Pt BIB EMS for vaginal discharge, dark urine, and dysuria. Had a fever x 1 day ago, hasn't had one since.

## 2023-09-18 NOTE — ED Provider Triage Note (Signed)
 Emergency Medicine Provider Triage Evaluation Note  Carolyn Jenkins , a 23 y.o. female  was evaluated in triage.  Pt complains of some vaginal spotting, discharge, dark urine, dysuria.  She endorses giving birth 2 months ago.  Denies other symptoms at this time  Review of Systems  Positive:  Negative:   Physical Exam  BP (!) 150/107 (BP Location: Right Arm)   Pulse 93   Temp 98.7 F (37.1 C)   Resp 16   Ht 5\' 10"  (1.778 m)   SpO2 100%   BMI 43.05 kg/m  Gen:   Awake, no distress   Resp:  Normal effort  MSK:   Moves extremities without difficulty  Other:    Medical Decision Making  Medically screening exam initiated at 10:43 PM.  Appropriate orders placed.  Lenita Quinones was informed that the remainder of the evaluation will be completed by another provider, this initial triage assessment does not replace that evaluation, and the importance of remaining in the ED until their evaluation is complete.     Elisa Guest, PA-C 09/18/23 2244

## 2023-09-19 DIAGNOSIS — N760 Acute vaginitis: Secondary | ICD-10-CM | POA: Diagnosis not present

## 2023-09-19 LAB — HIV ANTIBODY (ROUTINE TESTING W REFLEX): HIV Screen 4th Generation wRfx: NONREACTIVE

## 2023-09-19 LAB — WET PREP, GENITAL
Sperm: NONE SEEN
Trich, Wet Prep: NONE SEEN
WBC, Wet Prep HPF POC: >=10 — AB (ref <10)
Yeast Wet Prep HPF POC: NONE SEEN

## 2023-09-19 LAB — GC/CHLAMYDIA PROBE AMP (~~LOC~~) NOT AT ARMC
Chlamydia: NEGATIVE
Comment: NEGATIVE
Comment: NORMAL
Neisseria Gonorrhea: NEGATIVE

## 2023-09-19 LAB — RPR: RPR Ser Ql: NONREACTIVE

## 2023-09-19 MED ORDER — LISINOPRIL 10 MG PO TABS: 10.0000 mg | ORAL_TABLET | Freq: Every day | ORAL | 0 refills | Status: DC

## 2023-09-19 MED ORDER — METRONIDAZOLE 500 MG PO TABS: 500.0000 mg | ORAL_TABLET | Freq: Two times a day (BID) | ORAL | 0 refills | Status: AC

## 2023-09-19 MED ORDER — CEPHALEXIN 500 MG PO CAPS: 500.0000 mg | ORAL_CAPSULE | Freq: Three times a day (TID) | ORAL | 0 refills | Status: AC

## 2023-09-19 NOTE — ED Provider Notes (Signed)
 [place] EMERGENCY DEPARTMENT AT [facility] Provider Note   CSN: [id number] Arrival date & time: 09/18/23  2217     History  Chief Complaint  Patient presents with   Vaginal Discharge    Carolyn Jenkins is a 23 y.o. female.  The history is provided by the patient.  Patient w/history of hypertension and obesity presents for multiple complaints.  Patient reports she needs a refill of her BP meds, has been on lisinopril and nifedipine previously Patient reports she gave birth to her child over 2 months ago in [place] , she now think she is pregnant again.  She reports has been having a bloody discharge and cramping and has photos to prove it She also reports dysuria.  She is also requesting a full battery of STD testing  She is not currently breast-feeding  Home Medications Prior to Admission medications   Medication Sig Start Date End Date Taking? Authorizing Provider  cephALEXin (KEFLEX) 500 MG capsule Take 1 capsule (500 mg total) by mouth 3 (three) times daily. 09/19/23  Yes Eldon Greenland, MD  lisinopril (ZESTRIL) 10 MG tablet Take 1 tablet (10 mg total) by mouth daily. 09/19/23  Yes Eldon Greenland, MD  metroNIDAZOLE (FLAGYL) 500 MG tablet Take 1 tablet (500 mg total) by mouth 2 (two) times daily. 09/19/23  Yes Eldon Greenland, MD  ferrous sulfate 325 (65 FE) MG tablet Take 325 mg by mouth daily.    [provider]  OZEMPIC , 1 MG/DOSE, 4 MG/3ML SOPN Inject 1 mg into the skin every Tuesday. 01/02/21   [provider]  sertraline  (ZOLOFT ) 25 MG tablet Take 1 tablet (25 mg total) by mouth daily. 01/26/21   Tor Freed, FNP  traZODone  (DESYREL ) 50 MG tablet Take 1 tablet (50 mg total) by mouth at bedtime as needed for sleep. 01/25/21   Tor Freed, FNP  Vitamin D , Ergocalciferol , (DRISDOL ) 1.25 MG (50000 UNIT) CAPS capsule Take 50,000 Units by mouth every Tuesday. 01/02/21   [provider]      Allergies    Clindamycin /lincomycin     Review of Systems   Review of Systems  Physical Exam Updated Vital Signs BP (!) 156/108   Pulse 90   Temp 98.5 F (36.9 C)   Resp 16   Ht 1.778 m (5\' 10" )   SpO2 100%   BMI 43.05 kg/m  Physical Exam CONSTITUTIONAL: Well developed/well nourished, no distress on her phone HEAD: Normocephalic/atraumatic ABDOMEN: soft, obese GU: Pelvic exam chaperoned by nurse Sina No vaginal discharge.  No CMT.  No vaginal abscess or lesions.  Whitish discharge noted NEURO: Pt is awake/alert/appropriate, moves all extremitiesx4.  No facial droop.   PSYCH: Anxious  ED Results / Procedures / Treatments   Labs (all labs ordered are listed, but only abnormal results are displayed) Labs Reviewed  WET PREP, GENITAL - Abnormal; Notable for the following components:      Result Value   Clue Cells Wet Prep HPF POC PRESENT (*)    WBC, Wet Prep HPF POC >=10 (*)    All other components within normal limits  URINALYSIS, ROUTINE W REFLEX MICROSCOPIC - Abnormal; Notable for the following components:   APPearance CLOUDY (*)    Hgb urine dipstick LARGE (*)    Protein, ur 100 (*)    Leukocytes,Ua LARGE (*)    Bacteria, UA RARE (*)    Non Squamous Epithelial 0-5 (*)    All other components within normal limits  PREGNANCY, URINE  RPR  HIV ANTIBODY (ROUTINE TESTING W REFLEX)  GC/CHLAMYDIA PROBE AMP ([place]) NOT AT [facility]    EKG None  Radiology No results found.  Procedures Procedures    Medications Ordered in ED Medications - No data to display  ED Course/ Medical Decision Making/ A&P Clinical Course as of 09/19/23 0557  Thu Sep 19, 2023  0557 Will treat for UTI and bacterial vaginosis. No indication for abdominal or pelvic imaging at this time.  Patient is otherwise in no distress.  Will restart one of her home BP meds and refer to a local PCP now that she is established in [place]. Patient is safe for discharge [DW]    Clinical Course User Index [DW] Eldon Greenland, MD                                  Medical Decision Making Amount and/or Complexity of Data Reviewed Labs: ordered.  Risk Prescription drug management.           Final Clinical Impression(s) / ED Diagnoses Final diagnoses:  Primary hypertension  Vaginal discharge  BV (bacterial vaginosis)  Acute cystitis with hematuria    Rx / DC Orders ED Discharge Orders          Ordered    lisinopril  (ZESTRIL ) 10 MG tablet  Daily        09/19/23 0347    metroNIDAZOLE  (FLAGYL ) 500 MG tablet  2 times daily        09/19/23 0556    cephALEXin  (KEFLEX ) 500 MG capsule  3 times daily        09/19/23 0556              Eldon Greenland, MD 09/19/23 [phone]

## 2023-09-28 ENCOUNTER — Inpatient Hospital Stay (HOSPITAL_COMMUNITY)
Admission: AD | Admit: 2023-09-28 | Discharge: 2023-09-29 | Disposition: A | Discharge status: Home / Self Care | Attending: Obstetrics & Gynecology | Admitting: Obstetrics & Gynecology

## 2023-09-28 ENCOUNTER — Inpatient Hospital Stay (HOSPITAL_COMMUNITY)

## 2023-09-28 DIAGNOSIS — Z3A00 Weeks of gestation of pregnancy not specified: Secondary | ICD-10-CM | POA: Diagnosis not present

## 2023-09-28 DIAGNOSIS — O039 Complete or unspecified spontaneous abortion without complication: Secondary | ICD-10-CM | POA: Diagnosis not present

## 2023-09-28 DIAGNOSIS — O209 Hemorrhage in early pregnancy, unspecified: Secondary | ICD-10-CM | POA: Diagnosis present

## 2023-09-28 LAB — CBC
HCT: 36.2 % (ref 36.0–46.0)
Hemoglobin: 11.6 g/dL — ABNORMAL LOW (ref 12.0–15.0)
MCH: 26.6 pg (ref 26.0–34.0)
MCHC: 32 g/dL (ref 30.0–36.0)
MCV: 83 fL (ref 80.0–100.0)
Platelets: 296 10*3/uL (ref 150–400)
RBC: 4.36 MIL/uL (ref 3.87–5.11)
RDW: 14.5 % (ref 11.5–15.5)
WBC: 6.8 10*3/uL (ref 4.0–10.5)
nRBC: 0 % (ref 0.0–0.2)

## 2023-09-28 LAB — ABO/RH: ABO/RH(D): O POS

## 2023-09-28 LAB — URINALYSIS, ROUTINE W REFLEX MICROSCOPIC
Bilirubin Urine: NEGATIVE
Glucose, UA: NEGATIVE mg/dL
Ketones, ur: NEGATIVE mg/dL
Nitrite: NEGATIVE
Protein, ur: 30 mg/dL — AB
RBC / HPF: >50 RBC/hpf (ref 0–5)
Specific Gravity, Urine: 1.023 (ref 1.005–1.030)
pH: 5 (ref 5.0–8.0)

## 2023-09-28 LAB — HCG, QUANTITATIVE, PREGNANCY: hCG, Beta Chain, Quant, S: <1 m[IU]/mL (ref <5)

## 2023-09-28 MED ORDER — SLYND 4 MG PO TABS: 1.0000 | ORAL_TABLET | Freq: Every day | ORAL | 3 refills | Status: AC

## 2023-09-28 NOTE — MAU Note (Signed)
 RN spoke to [employer] and arranged transport. Taxi service informed RN that they are unsure of how long it will be until another cab is available.

## 2023-09-28 NOTE — MAU Note (Signed)
 Patient needing assistance getting home. RN asked patient about using a bus pass or cab voucher and informed patient that cab wait times can vary. Patient opted for cab voucher. RN notified [facility] of need for cab voucher.

## 2023-09-28 NOTE — Discharge Instructions (Signed)
 [facility] (who can place your [facility]) Address: [address], [place], [place] [address] Phone: [phone]

## 2023-09-28 NOTE — MAU Note (Addendum)
 MAU Triage Note:  .Carolyn Jenkins is a 23 y.o. at Unknown here in MAU reporting: arrived via EMS for sudden upper abdominal cramping and bleeding beginning around an hour ago. The bleeding is * and she has passed some clots. Patient reports that she went to the doctor to confirm viability two weeks ago, but is unsure of the results.   Negative UPT in Epic on 09/18/2023 after being seen in the ED for similar pain to that she is having today.   UPT in MAU positive.  Patient complaint: EMS bleeding, cramping  Pain Score: 5  Pain Location: Abdomen     Onset of complaint: today LMP: Patient's last menstrual period was 08/30/2023 (approximate).  Vitals:   09/28/23 1935  BP: (!) 141/72  Pulse: (!) 108  Resp: 18  Temp: 98.9 F (37.2 C)  SpO2: 97%    Lab orders placed from triage: UPT

## 2023-09-29 NOTE — MAU Provider Note (Signed)
 Chief Complaint: Abdominal Pain and Vaginal Bleeding  SUBJECTIVE HPI: Carolyn Jenkins is a 23 y.o. multip ~4 weeks by LMP who presents for cramping and bleeding.  Patient states that she had a positive pregnancy test at home.  She developed heavy bleeding today with clots and cramping.  Denies fever/chills, lightheaded/dizziness, change in discharge, urinary symptoms.  She has not had an ultrasound during this pregnancy.  States it is an unintended pregnancy.  She was additionally seen at the main ED 5/28 for UTI and BV infection.  Her UPT was negative at that time.  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  Tobacco Use   Smoking status: Every Day    Types: Cigars   Smokeless tobacco: Never   Tobacco comments:    5 per day, Black and Milds  Substance and Sexual Activity   Alcohol use: No   Drug use: Never   Sexual activity: Not on file  Other Topics Concern   Not on file  Social History Narrative   Not on file   Social Drivers of Health   Financial Resource Strain: Not on file  Food Insecurity: Low Risk  (11/22/2022)   Received from [facility]   Hunger Vital Sign    Worried About Running Out of Food in the Last Year: Never true    Ran Out of Food in the Last Year: Never true  Transportation Needs: Unmet Transportation Needs (11/22/2022)   Received from [facility]    In the past 12  months, has lack of reliable transportation kept you from medical appointments, meetings, work or from getting things needed for daily living? : Yes  Physical Activity: Not on file  Stress: Not on file  Social Connections: Unknown (10/01/2021)   Received from [facility]   Social Network    Social Network: Not on file  Intimate Partner Violence: Low Risk  (05/24/2023)   Received from [facility]   Interpersonal Safety    Feels UN-safe at Home or Work/School: no    Verbal Abuse: Denies    Physical Signs of Abuse Present: no   No current facility-administered medications on file prior to encounter.   Current Outpatient Medications on File Prior to Encounter  Medication Sig Dispense Refill   cephALEXin  (KEFLEX ) 500 MG capsule Take 1 capsule (500 mg total) by mouth 3 (three) times daily. 15 capsule 0   ferrous sulfate 325 (65 FE) MG tablet Take 325 mg by mouth daily.     lisinopril  (ZESTRIL ) 10 MG tablet Take 1 tablet (10 mg total) by mouth daily. 30 tablet 0   metroNIDAZOLE  (FLAGYL ) 500 MG tablet Take 1 tablet (500 mg total) by mouth 2 (two) times daily. 14 tablet 0   OZEMPIC , 1 MG/DOSE, 4 MG/3ML SOPN Inject 1 mg into the skin every Tuesday.  sertraline  (ZOLOFT ) 25 MG tablet Take 1 tablet (25 mg total) by mouth daily. 30 tablet 0   traZODone  (DESYREL ) 50 MG tablet Take 1 tablet (50 mg total) by mouth at bedtime as needed for sleep. 30 tablet 0   Vitamin D , Ergocalciferol , (DRISDOL ) 1.25 MG (50000 UNIT) CAPS capsule Take 50,000 Units by mouth every Tuesday.     Allergies  Allergen Reactions   Clindamycin /Lincomycin     ROS:  Pertinent positives/negatives listed above.  I have reviewed patient's Past Medical Hx, Surgical Hx, Family Hx, Social Hx, medications and allergies.   Physical Exam  Patient Vitals for the past 24 hrs:  BP Temp Temp src Pulse Resp SpO2 Height Weight  09/28/23 2313 (!) 102/54 -- -- 93 -- -- -- --  09/28/23 1935 (!) 141/72  98.9 F (37.2 C) Oral (!) 108 18 97 % 5\' 11"  (1.803 m) (!) 142.4 kg   Constitutional: Well-developed, well-nourished female in no acute distress Cardiovascular: normal rate Respiratory: normal effort GI: Abd soft, non-tender. Pos BS x 4 MS: Extremities nontender, no edema, normal ROM Neurologic: Alert and oriented x 4 GU: Neg CVAT  LAB RESULTS Results for orders placed or performed during the hospital encounter of 09/28/23 (from the past 24 hours)  Urinalysis, Routine w reflex microscopic -Urine, Clean Catch     Status: Abnormal   Collection Time: 09/28/23  7:47 PM  Result Value Ref Range   Color, Urine YELLOW YELLOW   APPearance HAZY (A) CLEAR   Specific Gravity, Urine 1.023 1.005 - 1.030   pH 5.0 5.0 - 8.0   Glucose, UA NEGATIVE NEGATIVE mg/dL   Hgb urine dipstick LARGE (A) NEGATIVE   Bilirubin Urine NEGATIVE NEGATIVE   Ketones, ur NEGATIVE NEGATIVE mg/dL   Protein, ur 30 (A) NEGATIVE mg/dL   Nitrite NEGATIVE NEGATIVE   Leukocytes,Ua TRACE (A) NEGATIVE   RBC / HPF >50 0 - 5 RBC/hpf   WBC, UA 0-5 0 - 5 WBC/hpf   Bacteria, UA FEW (A) NONE SEEN   Squamous Epithelial / HPF 0-5 0 - 5 /HPF   Mucus PRESENT   hCG, quantitative, pregnancy     Status: None   Collection Time: 09/28/23  9:06 PM  Result Value Ref Range   hCG, Beta Chain, Quant, S <1 <5 mIU/mL  CBC     Status: Abnormal   Collection Time: 09/28/23  9:06 PM  Result Value Ref Range   WBC 6.8 4.0 - 10.5 K/uL   RBC 4.36 3.87 - 5.11 MIL/uL   Hemoglobin 11.6 (L) 12.0 - 15.0 g/dL   HCT 16.1 09.6 - 04.5 %   MCV 83.0 80.0 - 100.0 fL   MCH 26.6 26.0 - 34.0 pg   MCHC 32.0 30.0 - 36.0 g/dL   RDW 40.9 81.1 - 91.4 %   Platelets 296 150 - 400 K/uL   nRBC 0.0 0.0 - 0.2 %  ABO/Rh     Status: None   Collection Time: 09/28/23  9:06 PM  Result Value Ref Range   ABO/RH(D) O POS    No rh immune globuloin      NOT A RH IMMUNE GLOBULIN CANDIDATE, PT RH POSITIVE Performed at [facility], [address], [place], [place]  [address]     --/--/O POS (06/07 2106)  IMAGING US  OB LESS THAN 14 WEEKS WITH OB TRANSVAGINAL Result Date: 09/28/2023 CLINICAL DATA:  Vaginal bleeding EXAM: OBSTETRIC <14 WK US  AND TRANSVAGINAL OB US  TECHNIQUE: Both transabdominal and transvaginal ultrasound examinations  were performed for complete evaluation of the gestation as well as the maternal uterus, adnexal regions, and pelvic cul-de-sac. Transvaginal technique was performed to assess early pregnancy. COMPARISON:  None Available. FINDINGS: Intrauterine gestational sac: None Yolk sac:  Not Visualized. Embryo:  Not Visualized. Cardiac Activity: Not Visualized. Heart Rate:   bpm MSD:   mm    w     d CRL:    mm    w    d                  US  EDC: Subchorionic hemorrhage:  None visualized. Maternal uterus/adnexae: No adnexal mass. Small amount of free fluid in the pelvis. Uterus is retroflexed. IMPRESSION: No intrauterine pregnancy visualized. Differential considerations would include early intrauterine pregnancy too early to visualize, spontaneous abortion, or occult ectopic pregnancy. Recommend close clinical followup and serial quantitative beta HCGs and ultrasounds. Electronically Signed   By: Janeece Mechanic M.D.   On: 09/28/2023 21:02    MAU Management/MDM: Orders Placed This Encounter  Procedures   US  OB LESS THAN 14 WEEKS WITH OB TRANSVAGINAL   Urinalysis, Routine w reflex microscopic -Urine, Clean Catch   hCG, quantitative, pregnancy   CBC   ABO/Rh   Discharge patient    Meds ordered this encounter  Medications   Drospirenone (SLYND) 4 MG TABS    Sig: Take 1 tablet (4 mg total) by mouth daily.    Dispense:  90 tablet    Refill:  3    Patient presents for cramping and bleeding early in pregnancy without confirmed IUP.  Ectopic pregnancy workup performed given positive UPT in MAU.  Remarkable for negative quantitative hCG as well as unremarkable transvaginal ultrasound.  This is consistent with a complete miscarriage at this time.  She  states that she does not want to be pregnant at this time and desires a Nexplanon.  Sent message to [facility] for new patient appointment and Nexplanon insertion.  I did prescribe POPS to bridge her to that appointment (has a history of migraines).  ASSESSMENT 1. Miscarriage     PLAN Discharge home with strict return precautions. Allergies as of 09/29/2023       Reactions   Clindamycin /lincomycin         Medication List     TAKE these medications    cephALEXin  500 MG capsule Commonly known as: KEFLEX  Take 1 capsule (500 mg total) by mouth 3 (three) times daily.   ferrous sulfate 325 (65 FE) MG tablet Take 325 mg by mouth daily.   lisinopril  10 MG tablet Commonly known as: ZESTRIL  Take 1 tablet (10 mg total) by mouth daily.   metroNIDAZOLE  500 MG tablet Commonly known as: FLAGYL  Take 1 tablet (500 mg total) by mouth 2 (two) times daily.   Ozempic  (1 MG/DOSE) 4 MG/3ML Sopn Generic drug: Semaglutide  (1 MG/DOSE) Inject 1 mg into the skin every Tuesday.   sertraline  25 MG tablet Commonly known as: ZOLOFT  Take 1 tablet (25 mg total) by mouth daily.   Slynd 4 MG Tabs Generic drug: Drospirenone Take 1 tablet (4 mg total) by mouth daily.   traZODone  50 MG tablet Commonly known as: DESYREL  Take 1 tablet (50 mg total) by mouth at bedtime as needed for sleep.   Vitamin D  (Ergocalciferol ) 1.25 MG (50000 UNIT) Caps capsule Commonly known as: DRISDOL  Take 50,000 Units by mouth every Tuesday.         Authur Leghorn, MD OB Fellow 09/29/2023  12:31 AM

## 2023-09-29 NOTE — MAU Note (Signed)
 Taxi cab picked up patient from Entrance C.

## 2023-10-03 ENCOUNTER — Ambulatory Visit

## 2023-10-29 ENCOUNTER — Other Ambulatory Visit (HOSPITAL_COMMUNITY)
Admission: RE | Admit: 2023-10-29 | Discharge: 2023-10-29 | Disposition: A | Discharge status: Home / Self Care | Source: Ambulatory Visit | Attending: Obstetrics & Gynecology | Admitting: Obstetrics & Gynecology

## 2023-10-29 ENCOUNTER — Encounter: Payer: Self-pay | Admitting: Obstetrics & Gynecology

## 2023-10-29 ENCOUNTER — Ambulatory Visit: Admitting: Obstetrics & Gynecology

## 2023-10-29 VITALS — BP 155/98 | HR 87 | Ht 70.0 in | Wt 323.0 lb

## 2023-10-29 DIAGNOSIS — I10 Essential (primary) hypertension: Secondary | ICD-10-CM

## 2023-10-29 DIAGNOSIS — Z3202 Encounter for pregnancy test, result negative: Secondary | ICD-10-CM | POA: Diagnosis not present

## 2023-10-29 DIAGNOSIS — Z01419 Encounter for gynecological examination (general) (routine) without abnormal findings: Secondary | ICD-10-CM | POA: Diagnosis not present

## 2023-10-29 DIAGNOSIS — O039 Complete or unspecified spontaneous abortion without complication: Secondary | ICD-10-CM | POA: Insufficient documentation

## 2023-10-29 DIAGNOSIS — Z1331 Encounter for screening for depression: Secondary | ICD-10-CM | POA: Diagnosis not present

## 2023-10-29 DIAGNOSIS — Z30017 Encounter for initial prescription of implantable subdermal contraceptive: Secondary | ICD-10-CM

## 2023-10-29 LAB — POCT URINALYSIS DIPSTICK
Bilirubin, UA: POSITIVE
Glucose, UA: POSITIVE — AB
Ketones, UA: NEGATIVE
Nitrite, UA: NEGATIVE
Protein, UA: POSITIVE — AB
Spec Grav, UA: >=1.03 — AB (ref 1.010–1.025)
Urobilinogen, UA: 0.2 U/dL
pH, UA: 6 (ref 5.0–8.0)

## 2023-10-29 LAB — POCT URINE PREGNANCY: Preg Test, Ur: NEGATIVE

## 2023-10-29 MED ORDER — LISINOPRIL 10 MG PO TABS
10.0000 mg | ORAL_TABLET | Freq: Every day | ORAL | 0 refills | Status: DC
Start: 2023-10-29 — End: 2023-10-29

## 2023-10-29 MED ORDER — LISINOPRIL 10 MG PO TABS: 10.0000 mg | ORAL_TABLET | Freq: Every day | ORAL | 0 refills | Status: AC

## 2023-10-29 MED ORDER — ETONOGESTREL 68 MG ~~LOC~~ IMPL
68.0000 mg | DRUG_IMPLANT | Freq: Once | SUBCUTANEOUS | Status: AC
  Administered 2023-10-29: 68 mg via SUBCUTANEOUS

## 2023-10-29 NOTE — Progress Notes (Signed)
 GYNECOLOGY OFFICE PROCEDURE NOTE  Carolyn Jenkins is a 23 y.o. G2P1011 here for Nexplanon  insertion.  Last pap smear was on 10/29/2023.  No other gynecologic concerns.  Nexplanon  Insertion Procedure Patient identified, informed consent performed, consent signed.   Patient does understand that irregular bleeding is a very common side effect of this medication. She was advised to have backup contraception for one week after placement. Pregnancy test in clinic today was negative.  Appropriate time out taken.  Patient's left arm was prepped and draped in the usual sterile fashion. The ruler used to measure and mark insertion area.  Patient was prepped with alcohol swab and then injected with 3 ml of 1% lidocaine .  She was prepped with betadine, Nexplanon  removed from packaging,  Device confirmed in needle, then inserted full length of needle and withdrawn per handbook instructions. Nexplanon  was able to palpated in the patient's arm; patient palpated the insert herself. There was minimal blood loss.  Patient insertion site covered with gauze and a pressure bandage to reduce any bruising.  The patient tolerated the procedure well and was given post procedure instructions.    Eveline Lynwood MATSU, MD Attending Obstetrician & Gynecologist, [facility] and Center for [facility]  10/29/2023

## 2023-10-29 NOTE — Progress Notes (Addendum)
 23 y.o. New GYN presents for AEX/PAP/STD screening and Birth control.   C/o yellow, malodorous, vaginal discharge, lower abdominal pain 2/10.  Pt thinks she might have been exposed to STD and wants a full Panel done including HSV.  Pt wants to start [facility] today, she wants Nexplanon .  UPT Negative  BP are elevated, she out of Rx x 1 week, she has an appointment with a PCP 12/06/23.  Pt is having HA 9/10 x 7+ days.  Administrations This Visit     etonogestrel  (NEXPLANON ) implant 68 mg     Admin Date 10/29/2023 Action Given Dose 68 mg Route Subdermal Documented By Burnard Niels PARAS, RMA

## 2023-10-29 NOTE — Progress Notes (Signed)
 GYNECOLOGY CLINIC ANNUAL PREVENTATIVE CARE ENCOUNTER NOTE  Subjective:   Carolyn Jenkins is a 23 y.o. G62P0011 female here for a routine annual gynecologic exam.  Current complaints: wants contraception with Nexplanon .   Denies abnormal vaginal bleeding, discharge, pelvic pain, problems with intercourse or other gynecologic concerns.    Gynecologic History Patient's last menstrual period was 10/27/2023 (exact date). Contraception: Nexplanon  Last Pap: 11/2022. Results were: abnormal ASCUS neg HR HPV done in [place]   Obstetric History OB History  Gravida Para Term Preterm AB Living  2    1 1   SAB IAB Ectopic Multiple Live Births      1    # Outcome Date GA Lbr Len/2nd Weight Sex Type Anes PTL Lv  2 Gravida 06/28/23    F CS-Unspec   LIV  1 AB             Past Medical History:  Diagnosis Date   Anemia    Anxiety    Pregnancy induced hypertension    Recurrent tonsillitis     Past Surgical History:  Procedure Laterality Date   CESAREAN SECTION     GALLBLADDER SURGERY     TONSILLECTOMY Bilateral 06/22/2019   Procedure: TONSILLECTOMY;  Surgeon: Jesus Oliphant, MD;  Location: [facility];  Service: ENT;  Laterality: Bilateral;    Current Outpatient Medications on File Prior to Visit  Medication Sig Dispense Refill   cephALEXin  (KEFLEX ) 500 MG capsule Take 1 capsule (500 mg total) by mouth 3 (three) times daily. 15 capsule 0   Drospirenone  (SLYND ) 4 MG TABS Take 1 tablet (4 mg total) by mouth daily. 90 tablet 3   ferrous sulfate 325 (65 FE) MG tablet Take 325 mg by mouth daily.     metroNIDAZOLE  (FLAGYL ) 500 MG tablet Take 1 tablet (500 mg total) by mouth 2 (two) times daily. 14 tablet 0   OZEMPIC , 1 MG/DOSE, 4 MG/3ML SOPN Inject 1 mg into the skin every Tuesday.     sertraline  (ZOLOFT ) 25 MG tablet Take 1 tablet (25 mg total) by mouth daily. 30 tablet 0   traZODone  (DESYREL ) 50 MG tablet Take 1 tablet (50 mg total) by mouth at bedtime as needed for sleep. 30 tablet  0   Vitamin D , Ergocalciferol , (DRISDOL ) 1.25 MG (50000 UNIT) CAPS capsule Take 50,000 Units by mouth every Tuesday.     No current facility-administered medications on file prior to visit.    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  Tobacco Use   Smoking status: Former    Types: Cigars    Quit date: 06/13/2023    Years since quitting: 0.3   Smokeless tobacco: Former    Quit date: 06/13/2023   Tobacco comments:    5 per day, Black and Milds  Vaping Use   Vaping status: Former  Substance and Sexual Activity   Alcohol use: No   Drug use: Never   Sexual activity: Yes    Birth control/protection: None  Other Topics Concern   Not on file  Social History Narrative   Not on file   Social Drivers of Health   Financial Resource Strain: Not on file  Food Insecurity:  Low Risk  (11/22/2022)   Received from [facility]   Hunger Vital Sign    Within the past 12 months, you worried that your food would run out before you got money to buy more: Never true    Within the past 12 months, the food you bought just didn't last and you didn't have money to get more. : Never true  Transportation Needs: Unmet Transportation Needs (11/22/2022)   Received from [facility]    In the past 12 months, has lack of reliable transportation kept you from medical appointments, meetings, work or from getting things needed for daily living? : Yes  Physical Activity: Not on file  Stress: Not on file  Social Connections: Unknown (10/01/2021)   Received from [facility]   Social Network    Social Network: Not on file  Intimate Partner Violence: Low Risk  (05/24/2023)   Received from [facility]    Interpersonal Safety    Feels UN-safe at Home or Work/School: no    Verbal Abuse: Denies    Physical Signs of Abuse Present: no    History reviewed. No pertinent family history.  The following portions of the patient's history were reviewed and updated as appropriate: allergies, current medications, past family history, past medical history, past social history, past surgical history and problem list.  Review of Systems Constitutional: negative Respiratory: negative Cardiovascular: negative Gastrointestinal: negative Genitourinary:negative   Objective:  BP (!) 155/98 (BP Location: Right Arm, Cuff Size: Large)   Pulse 87   Ht 5' 10 (1.778 m)   Wt (!) 323 lb (146.5 kg)   LMP 10/27/2023 (Exact Date)   BMI 46.35 kg/m  CONSTITUTIONAL: Well-developed, obese female in no acute distress.  HENT:  Normocephalic, atraumatic, External right and left ear normal. Oropharynx is clear and moist EYES: Conjunctivae and EOM are normal. Pupils are equal, round, and reactive to light. No scleral icterus.  NECK: Normal range of motion, supple, no masses.  Normal thyroid .  SKIN: Skin is warm and dry. No rash noted. Not diaphoretic. No erythema. No pallor. NEUROLGIC: Alert and oriented to person, place, and time. Normal reflexes, muscle tone coordination. No cranial nerve deficit noted. PSYCHIATRIC: Normal mood and affect. Normal behavior. Normal judgment and thought content. CARDIOVASCULAR: Normal heart rate noted, regular rhythm RESPIRATORY: Clear to auscultation bilaterally. Effort and breath sounds normal, no problems with respiration noted. ABDOMEN: Soft, normal bowel sounds, no distention noted.  No tenderness, rebound or guarding.  PELVIC: Normal appearing external genitalia; normal appearing vaginal mucosa and cervix.  No abnormal discharge noted.  Pap smear obtained.  Normal uterine size, no other palpable masses, no uterine or adnexal tenderness. MUSCULOSKELETAL: Normal range of motion. No  tenderness.  No cyanosis, clubbing, or edema. .   Assessment:  Annual gynecologic examination with pap smear  Spontaneous miscarriage - Plan: Cytology - PAP( [place]), Cervicovaginal ancillary only( [place]), Hepatitis C Antibody, Hepatitis B Surface AntiGEN, lisinopril  (ZESTRIL ) 10 MG tablet, HIV antibody (with reflex), RPR, POCT Urinalysis Dipstick, Urine Culture  Nexplanon  insertion  Obesities, morbid (HCC)  Plan:  Will follow up results of pap smear and manage accordingly. Routine preventative health maintenance measures emphasized. Please refer to After Visit Summary for other counseling recommendations.    LYNWOOD SOLOMONS, MD Attending Obstetrician & Gynecologist Center for [employer], [facility]

## 2023-10-30 LAB — RPR: RPR Ser Ql: NONREACTIVE

## 2023-10-30 LAB — CERVICOVAGINAL ANCILLARY ONLY
Bacterial Vaginitis (gardnerella): NEGATIVE
Candida Glabrata: NEGATIVE
Candida Vaginitis: NEGATIVE
Chlamydia: NEGATIVE
Comment: NEGATIVE
Comment: NEGATIVE
Comment: NEGATIVE
Comment: NEGATIVE
Comment: NEGATIVE
Comment: NORMAL
Neisseria Gonorrhea: POSITIVE — AB
Trichomonas: NEGATIVE

## 2023-10-30 LAB — HEPATITIS B SURFACE ANTIGEN: Hepatitis B Surface Ag: NEGATIVE

## 2023-10-30 LAB — HEPATITIS C ANTIBODY: Hep C Virus Ab: NONREACTIVE

## 2023-10-30 LAB — HIV ANTIBODY (ROUTINE TESTING W REFLEX): HIV Screen 4th Generation wRfx: NONREACTIVE

## 2023-11-01 LAB — URINE CULTURE

## 2023-11-05 ENCOUNTER — Ambulatory Visit

## 2023-11-05 DIAGNOSIS — A5402 Gonococcal vulvovaginitis, unspecified: Secondary | ICD-10-CM | POA: Diagnosis not present

## 2023-11-05 LAB — CYTOLOGY - PAP
Comment: NEGATIVE
Diagnosis: NEGATIVE
Diagnosis: REACTIVE
High risk HPV: NEGATIVE

## 2023-11-05 MED ORDER — CEFTRIAXONE SODIUM 500 MG IJ SOLR
500.0000 mg | Freq: Once | INTRAMUSCULAR | Status: AC
  Administered 2023-11-05: 500 mg via INTRAMUSCULAR

## 2023-11-05 NOTE — Progress Notes (Signed)
 Pt reports for rocephin  injection due to + Gonorrhea result. Injection given in [facility]. Pt tolerated injection well. Instructed to return for repeat swab in 6 weeks.

## 2023-11-21 ENCOUNTER — Other Ambulatory Visit: Payer: Self-pay | Admitting: Obstetrics & Gynecology

## 2023-11-21 DIAGNOSIS — O039 Complete or unspecified spontaneous abortion without complication: Secondary | ICD-10-CM

## 2023-11-21 DIAGNOSIS — I10 Essential (primary) hypertension: Secondary | ICD-10-CM

## 2023-12-17 ENCOUNTER — Ambulatory Visit

## 2023-12-17 ENCOUNTER — Other Ambulatory Visit (HOSPITAL_COMMUNITY)
Admission: RE | Admit: 2023-12-17 | Discharge: 2023-12-17 | Disposition: A | Discharge status: Home / Self Care | Source: Ambulatory Visit | Attending: Obstetrics and Gynecology | Admitting: Obstetrics and Gynecology

## 2023-12-17 VITALS — BP 136/82 | HR 96

## 2023-12-17 DIAGNOSIS — Z113 Encounter for screening for infections with a predominantly sexual mode of transmission: Secondary | ICD-10-CM

## 2023-12-17 NOTE — Progress Notes (Signed)
 SUBJECTIVE:  23 y.o. female who presents for a STI screen. + Gonorrhea 10/30/23; treated with rocephin  on 11/05/23. Denies abnormal vaginal discharge, bleeding or significant pelvic pain. No UTI symptoms. Denies history of known exposure to STD.  Patient's last menstrual period was 11/02/2023 (exact date).  OBJECTIVE:  She appears well.   ASSESSMENT:  STI Screen   PLAN:  Pt offered STI blood screening-declined GC, chlamydia, and trichomonas probe sent to lab.  Treatment: To be determined once lab results are received.  Pt follow up as needed.

## 2023-12-18 LAB — CERVICOVAGINAL ANCILLARY ONLY
Bacterial Vaginitis (gardnerella): NEGATIVE
Candida Glabrata: NEGATIVE
Candida Vaginitis: POSITIVE — AB
Chlamydia: NEGATIVE
Comment: NEGATIVE
Comment: NEGATIVE
Comment: NEGATIVE
Comment: NEGATIVE
Comment: NEGATIVE
Comment: NORMAL
Neisseria Gonorrhea: NEGATIVE
Trichomonas: NEGATIVE

## 2023-12-19 ENCOUNTER — Ambulatory Visit: Payer: Self-pay | Admitting: Obstetrics and Gynecology

## 2023-12-19 DIAGNOSIS — B3731 Acute candidiasis of vulva and vagina: Secondary | ICD-10-CM

## 2023-12-19 MED ORDER — FLUCONAZOLE 150 MG PO TABS: 150.0000 mg | ORAL_TABLET | Freq: Once | ORAL | 0 refills | Status: AC

## 2024-03-03 ENCOUNTER — Ambulatory Visit

## 2024-03-10 ENCOUNTER — Ambulatory Visit
